# Patient Record
Sex: Male | Born: 1960 | Race: White | Hispanic: No | Smoking: Former smoker
Health system: Southern US, Community
[De-identification: ages and names within clinical notes are randomized; demographics above are authoritative.]

## PROBLEM LIST (undated history)

## (undated) DIAGNOSIS — E669 Obesity, unspecified: Secondary | ICD-10-CM

## (undated) DIAGNOSIS — I714 Abdominal aortic aneurysm, without rupture, unspecified: Secondary | ICD-10-CM

## (undated) DIAGNOSIS — E119 Type 2 diabetes mellitus without complications: Secondary | ICD-10-CM

## (undated) DIAGNOSIS — I471 Supraventricular tachycardia, unspecified: Secondary | ICD-10-CM

## (undated) DIAGNOSIS — E221 Hyperprolactinemia: Secondary | ICD-10-CM

## (undated) DIAGNOSIS — I2510 Atherosclerotic heart disease of native coronary artery without angina pectoris: Secondary | ICD-10-CM

## (undated) HISTORY — DX: Abdominal aortic aneurysm, without rupture, unspecified: I71.40

## (undated) HISTORY — DX: Obesity, unspecified: E66.9

## (undated) HISTORY — DX: Atherosclerotic heart disease of native coronary artery without angina pectoris: I25.10

## (undated) HISTORY — DX: Supraventricular tachycardia, unspecified: I47.10

## (undated) HISTORY — DX: Supraventricular tachycardia: I47.1

## (undated) HISTORY — DX: Type 2 diabetes mellitus without complications: E11.9

## (undated) HISTORY — DX: Abdominal aortic aneurysm, without rupture: I71.4

## (undated) HISTORY — PX: FOOT SURGERY: SHX648

## (undated) HISTORY — DX: Hyperprolactinemia: E22.1

---

## 2004-10-28 ENCOUNTER — Emergency Department: Payer: Self-pay | Admitting: Emergency Medicine

## 2004-12-30 ENCOUNTER — Ambulatory Visit: Payer: Self-pay | Admitting: Internal Medicine

## 2005-01-28 ENCOUNTER — Ambulatory Visit: Payer: Self-pay | Admitting: *Deleted

## 2005-01-31 ENCOUNTER — Ambulatory Visit: Payer: Self-pay | Admitting: Podiatry

## 2005-04-16 ENCOUNTER — Inpatient Hospital Stay: Payer: Self-pay | Admitting: Podiatry

## 2006-10-20 IMAGING — CT CT OF THE LEFT FOOT WITHOUT CONTRAST
3 series · 16 of 33 positions shown, 19 images · non-contrast
Comparison: none

REASON FOR EXAM: Charcot foot,  fractured cuboid
COMMENTS:

[Series 6: inspace · axial · 0.60mm/px · z∈[-1397,-1255]mm · 8 of 241 slices shown, 10 images]
[im 19/241  soft-tissue]
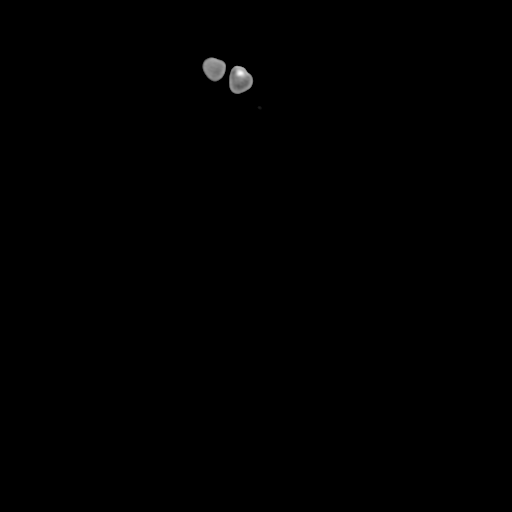
[im 19/241  bone]
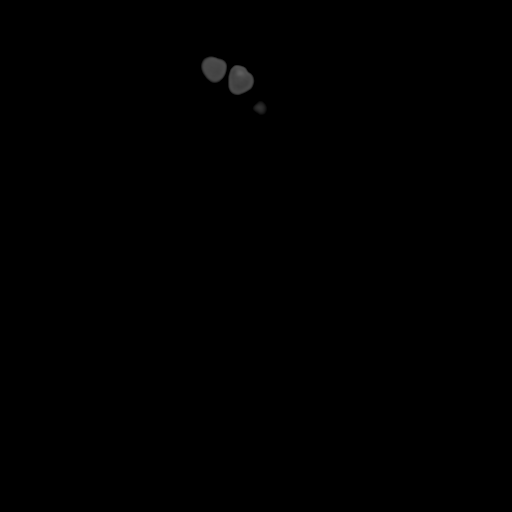
[im 56/241  bone]
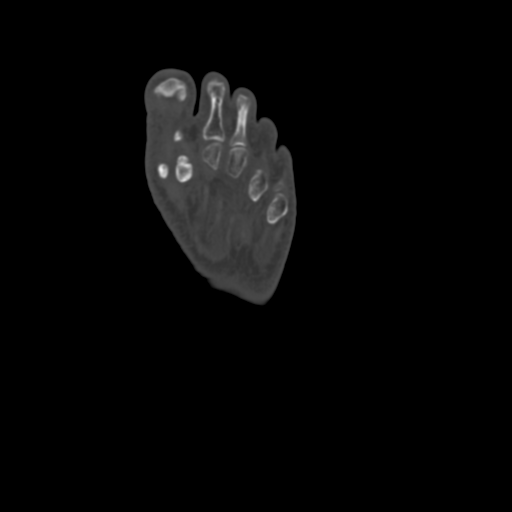
[im 74/241  bone]
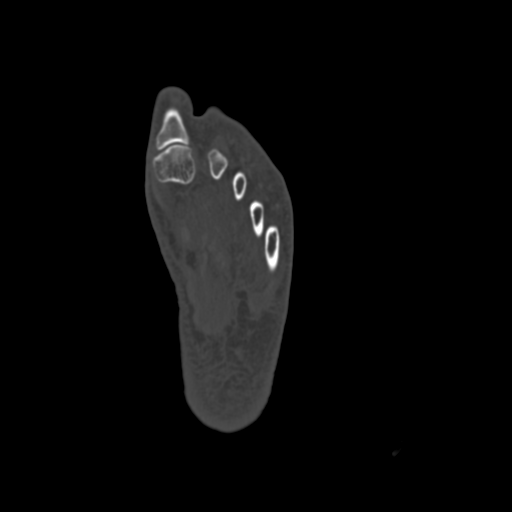
[im 111/241  bone]
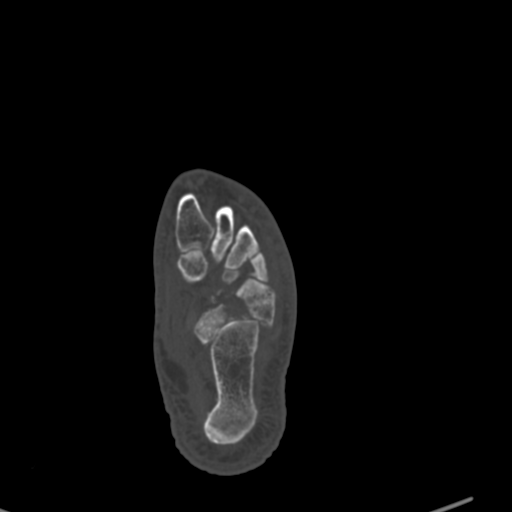
[im 130/241  soft-tissue]
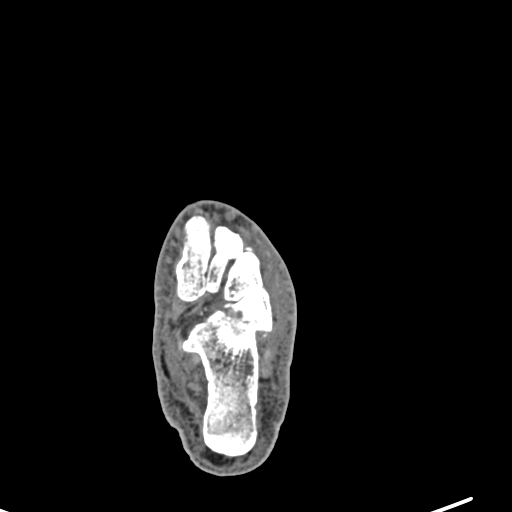
[im 130/241  bone]
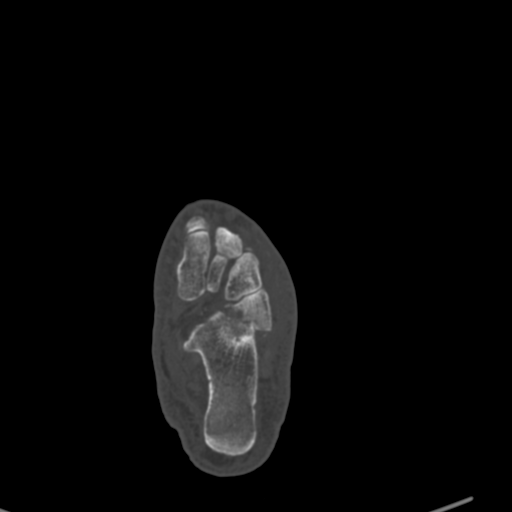
[im 167/241  bone]
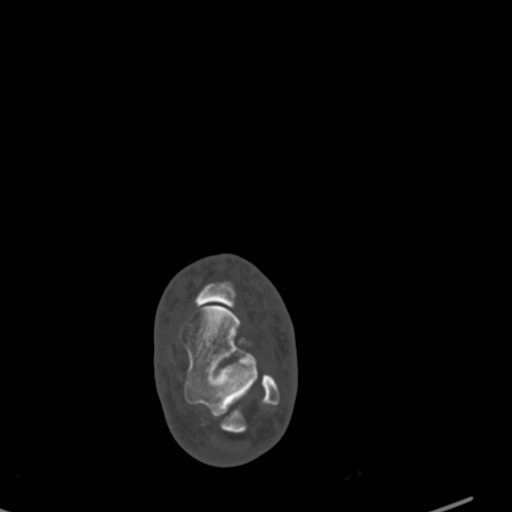
[im 185/241  bone]
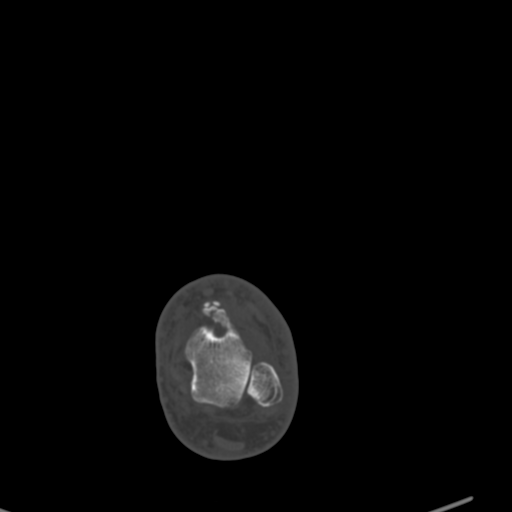
[im 222/241  bone]
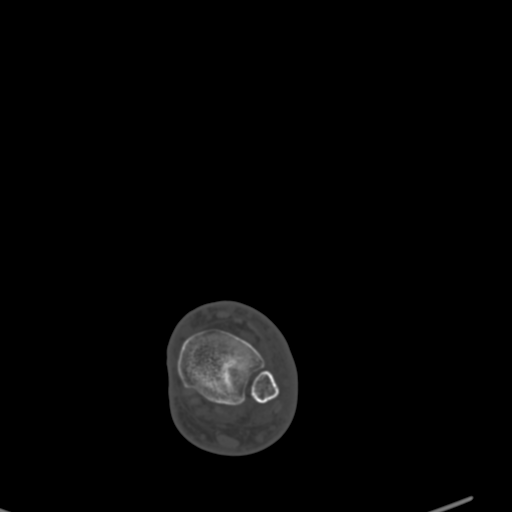

[Series 603: <mpr thick range> · coronal · 0.60mm/px · 3 of 120 slices shown]
[im 24/120  bone]
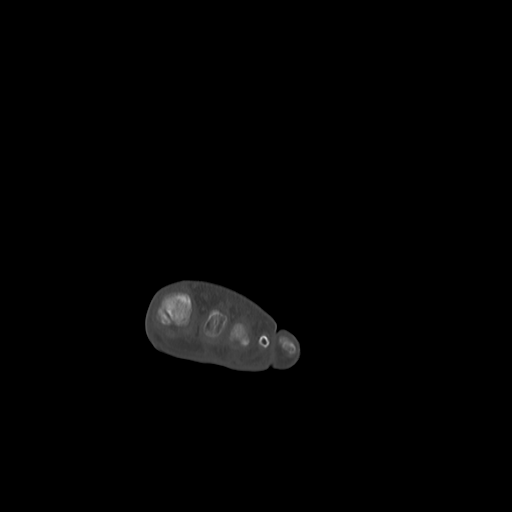
[im 48/120  bone]
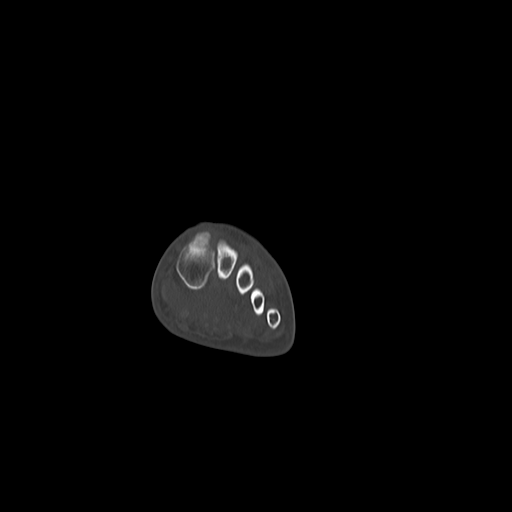
[im 72/120  bone]
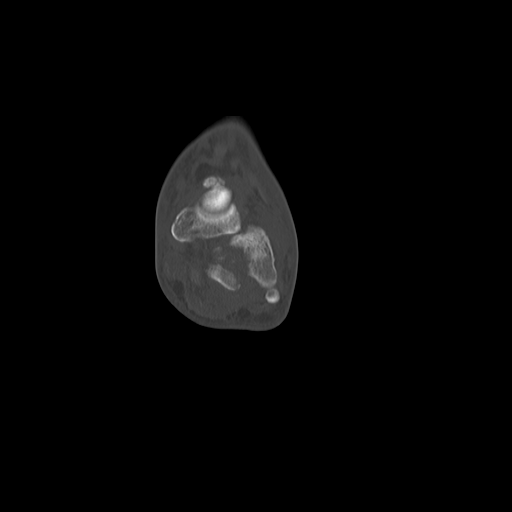

[Series 604: sagital · sagittal · 0.60mm/px · 5 of 42 slices shown, 6 images]
[im 14/42  bone]
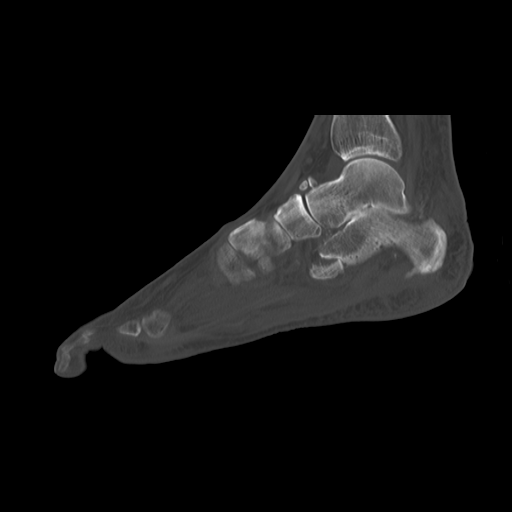
[im 18/42  bone]
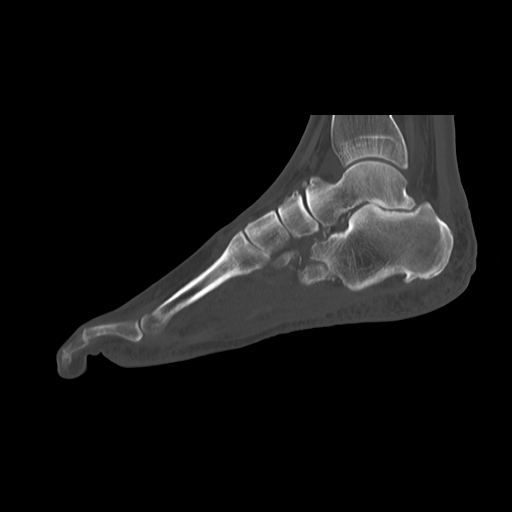
[im 21/42  soft-tissue]
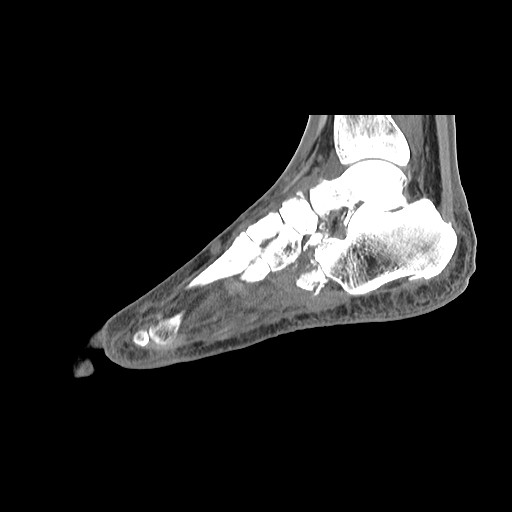
[im 21/42  bone]
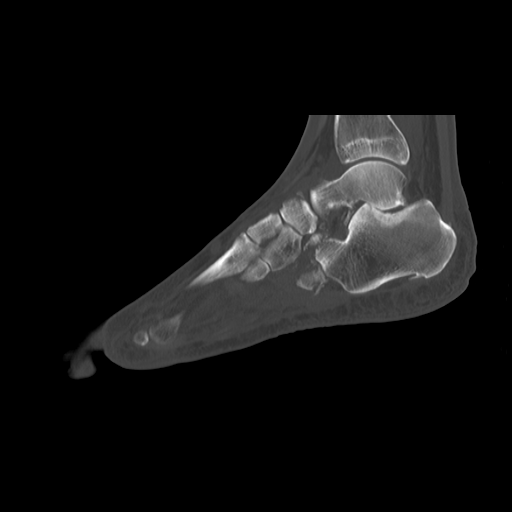
[im 24/42  bone]
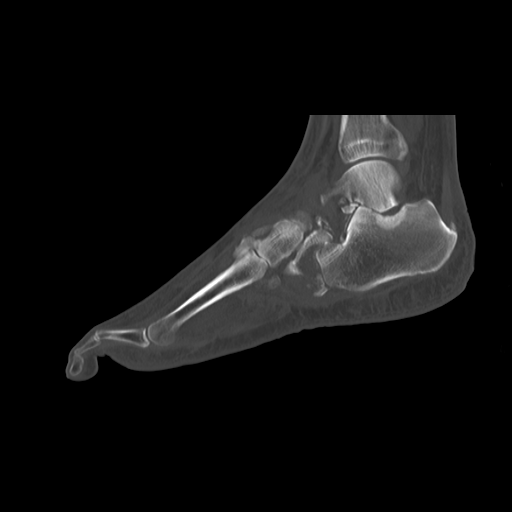
[im 28/42  bone]
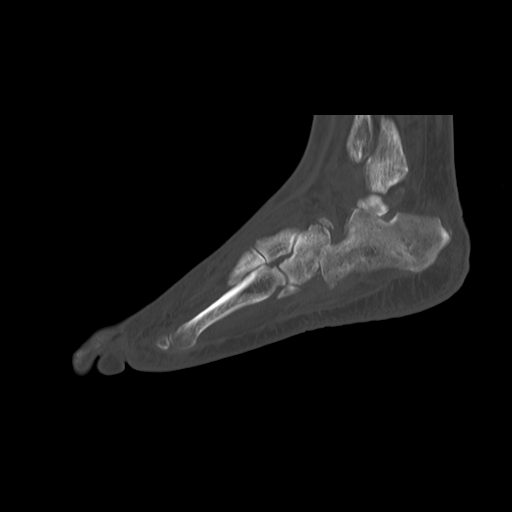

[16 of 33 positions shown; findings below may reference images not displayed]

PROCEDURE:     CT  - CT FOOT LEFT WITHOUT CONTRAST  - January 31, 2005  [DATE]

RESULT:         CT of the LEFT foot obtained with multiplanar/multislice
technique reveals multiple bony fragments about the subtalar calcaneal
cuboid and talonavicular joint with talocalcaneal joint subchondral
sclerosis and subluxation.  These changes are consistent with a Charcot
foot.  Small bony densities represent small fracture fragments.  There is a
large fracture fragment just anterior to the calcaneus. This may be arising
from the anterior aspect of the calcaneus or from the cuboid.  The malleoli
are intact. Cuneiform and proximal metatarsal heads are intact.  No lytic
lesions are noted.
IMPRESSION: 1.     Findings most consistent with Charcot foot with talocalcaneal,
calcaneocuboid and talonavicular degenerative change with associated
talocalcaneal subluxation.  Multiple bony fragments are noted about the
above described joints.
2.     There is a large bony fragment either arising from the anterior
calcaneus or from the cuboid.  This is just anterior to the calcaneus.

## 2010-12-08 ENCOUNTER — Emergency Department: Payer: Self-pay | Admitting: Unknown Physician Specialty

## 2011-02-18 HISTORY — PX: CARDIAC CATHETERIZATION: SHX172

## 2011-09-02 ENCOUNTER — Inpatient Hospital Stay: Payer: Self-pay | Admitting: Internal Medicine

## 2011-09-02 LAB — URINALYSIS, COMPLETE
Bilirubin,UR: NEGATIVE
Glucose,UR: >=500 mg/dL (ref 0–75)
Leukocyte Esterase: NEGATIVE
Nitrite: NEGATIVE
Protein: NEGATIVE
RBC,UR: <1 /HPF (ref 0–5)
Specific Gravity: 1.023 (ref 1.003–1.030)

## 2011-09-02 LAB — COMPREHENSIVE METABOLIC PANEL
Alkaline Phosphatase: 116 U/L (ref 50–136)
Anion Gap: 13 (ref 7–16)
Calcium, Total: 11.8 mg/dL — ABNORMAL HIGH (ref 8.5–10.1)
Co2: 27 mmol/L (ref 21–32)
EGFR (African American): >60
Osmolality: 283 (ref 275–301)
SGOT(AST): 37 U/L (ref 15–37)
SGPT (ALT): 83 U/L — ABNORMAL HIGH

## 2011-09-02 LAB — TROPONIN I: Troponin-I: 0.14 ng/mL — ABNORMAL HIGH

## 2011-09-02 LAB — CBC
HGB: 17 g/dL (ref 13.0–18.0)
MCV: 91 fL (ref 80–100)
Platelet: 215 10*3/uL (ref 150–440)
RBC: 5.63 10*6/uL (ref 4.40–5.90)
RDW: 13 % (ref 11.5–14.5)
WBC: 12 10*3/uL — ABNORMAL HIGH (ref 3.8–10.6)

## 2011-09-02 LAB — TSH: Thyroid Stimulating Horm: 2.04 u[IU]/mL

## 2011-09-03 LAB — CK TOTAL AND CKMB (NOT AT ARMC)
CK, Total: 38 U/L (ref 35–232)
CK, Total: 37 U/L (ref 35–232)
CK-MB: 0.7 ng/mL (ref 0.5–3.6)
CK-MB: 0.6 ng/mL (ref 0.5–3.6)

## 2011-09-03 LAB — LIPID PANEL
Cholesterol: 176 mg/dL (ref 0–200)
VLDL Cholesterol, Calc: 61 mg/dL — ABNORMAL HIGH (ref 5–40)

## 2011-09-03 LAB — TROPONIN I: Troponin-I: 0.12 ng/mL — ABNORMAL HIGH

## 2011-09-26 ENCOUNTER — Encounter: Payer: Self-pay | Admitting: Internal Medicine

## 2011-10-06 ENCOUNTER — Encounter: Payer: Self-pay | Admitting: Internal Medicine

## 2011-10-06 ENCOUNTER — Ambulatory Visit (INDEPENDENT_AMBULATORY_CARE_PROVIDER_SITE_OTHER): Payer: BC Managed Care – PPO | Admitting: Internal Medicine

## 2011-10-06 VITALS — BP 144/84 | HR 78 | Ht 74.0 in | Wt 285.2 lb

## 2011-10-06 DIAGNOSIS — I498 Other specified cardiac arrhythmias: Secondary | ICD-10-CM

## 2011-10-06 DIAGNOSIS — I471 Supraventricular tachycardia: Secondary | ICD-10-CM

## 2011-10-06 DIAGNOSIS — I714 Abdominal aortic aneurysm, without rupture: Secondary | ICD-10-CM

## 2011-10-06 DIAGNOSIS — R0789 Other chest pain: Secondary | ICD-10-CM

## 2011-10-06 DIAGNOSIS — R55 Syncope and collapse: Secondary | ICD-10-CM

## 2011-10-06 DIAGNOSIS — R002 Palpitations: Secondary | ICD-10-CM

## 2011-10-06 DIAGNOSIS — E119 Type 2 diabetes mellitus without complications: Secondary | ICD-10-CM | POA: Insufficient documentation

## 2011-10-06 MED ORDER — METOPROLOL TARTRATE 100 MG PO TABS: 100.0000 mg | ORAL_TABLET | Freq: Every day | ORAL | Status: DC

## 2011-10-06 MED ORDER — NADOLOL 40 MG PO TABS: 40.0000 mg | ORAL_TABLET | Freq: Every day | ORAL | Status: DC

## 2011-10-06 MED ORDER — ATENOLOL 100 MG PO TABS: 100.0000 mg | ORAL_TABLET | Freq: Two times a day (BID) | ORAL | Status: DC

## 2011-10-06 NOTE — Addendum Note (Signed)
Addended by: Sabino Snipes E on: 10/06/2011 12:29 PM   Modules accepted: Orders

## 2011-10-06 NOTE — Assessment & Plan Note (Signed)
Hemoglobin A1c was 9.4. Triglycerides are elevated and HDL is down. With her about the importance of exercise and weight loss.

## 2011-10-06 NOTE — Assessment & Plan Note (Signed)
The patient has had frequently recurring SVT which probably is mediated by a accessory pathway based on the initiation strips which we will able to review. He is currently treated with beta blockers and as his symptoms with the tachycardia are modest, he would like to continue with medical therapy if this will turn out to be effective. He is having significant somnolence with his metoprolol succinate/have given him prescriptions for metoprolol tartrate, atenolol, and Corgard to try as alternatives. We also discussed catheter ablation with its potential benefits and risks. For right now as noted he would like to pursue medical therapy. I am not sanguine that'll be sufficient but for right now it is reasonable.  His troponins were elevated during his episode of tachycardia. While this is attributable potentially to his tachycardia in the context of his known vascular disease and his diabetes his pretest likelihood of having coronary disease is quite high and I would recommend that he undergo stress testing with [name].  We will see him again in 8 wees

## 2011-10-06 NOTE — Patient Instructions (Addendum)
Your physician has recommended you make the following change in your medication:  1. Change Toprol to metoprolol tartrate 100 mg twice daily 2. Atenolol 100 mg daily 3. Nadolol 40 mg daily  You may try any of these medications, one at a time, in any order you want.  If you find you do not like one med, you may throw away and try the next medication.  Let us know which one you like best and we will send in refills.  Your physician wants you to follow-up in: 8-10 weeks with Dr. Graciela Husbands.

## 2011-10-06 NOTE — Progress Notes (Signed)
ELECTROPHYSIOLOGY CONSULT NOTE  Patient ID: Gabriel Alvarado, MRN: [id number], DOB/AGE: 53-Mar-1962 53 y.o. Admit date: (Not on file) Date of Consult: 10/06/2011  Primary Physician: No primary provider on file. Primary Cardiologist: KF  Chief Complaint: SVT   HPI Gabriel Alvarado is a 51 y.o. male  see at the request of [name] because of recurrent supraventricular tachycardia.  He is a 8 year history of diabetes as well as AAA disease and hyperprolactinemia. About a month ago he bent over and when he raised up his heart was racing. It was associated with a unusual breathing pattern but no significant shortness of breath. It was associated with chest tightness that radiated through to his back. There was no significant lightheadedness. He went to the emergency room where he received adenosine which terminated the tachycardia with only to resume. He was finally treated with a calcium blocker which allowed for its termination. Electrocardiogram was obtained which serendipitously recorded a resumption of tachycardia.   he was discharged on a beta blocker; because of recurring symptoms, he saw [name] who uptitrated his beta blocker from 50-100 mg metoprolol succinate with which he has had no significant recurrences.  An echocardiogram demonstrated normal left ventricular function normal left atrial size and mild concentric hypertrophy.  His functional status is quite good. He has mild dyspnea on exertion. He denies edema nocturnal dyspnea or orthopnea. He has had no exertional chest pain. He does have vascular disease as noted above. He has not had a stress test.  During his admission at the emergency room for his SVT his troponins were elevated Past Medical History  Diagnosis Date  . Diabetes mellitus   . Hyperprolactinemia   . AAA (abdominal aortic aneurysm)       Surgical History:  Past Surgical History  Procedure Date  . Foot surgery     left foot     Home Meds: Prior to  Admission medications   Medication Sig Start Date End Date Taking? Authorizing Provider  aspirin 81 MG tablet Take 81 mg by mouth daily.   Yes Historical Provider, MD  cabergoline (DOSTINEX) 0.5 MG tablet Takes 1/2 tablet once a week.   Yes Historical Provider, MD  glimepiride (AMARYL) 4 MG tablet Take 4 mg by mouth 2 (two) times daily.   Yes Historical Provider, MD  glucose blood test strip 1 each by Other route 3 (three) times daily. Use as instructed   Yes Historical Provider, MD  insulin glargine (LANTUS) 100 UNIT/ML injection Inject 40 Units into the skin at bedtime.   Yes Historical Provider, MD  metFORMIN (GLUCOPHAGE) 1000 MG tablet Takes 1 1/2 tablet daily.   Yes Historical Provider, MD  metoprolol succinate (TOPROL-XL) 50 MG 24 hr tablet Take 50 mg by mouth 2 (two) times daily. Take with or immediately following a meal.   Yes Historical Provider, MD  pioglitazone (ACTOS) 45 MG tablet Take 45 mg by mouth daily.   Yes Historical Provider, MD      Allergies:  Allergies  Allergen Reactions  . Penicillins   . Potassium-Containing Compounds     History   Social History  . Marital Status: Married    Spouse Name: N/A    Number of Children: N/A  . Years of Education: N/A   Occupational History  . Not on file.   Social History Main Topics  . Smoking status: Former Smoker -- 2.0 packs/day for 15 years    Types: Cigarettes    Quit date: 06/20/1990  .  Smokeless tobacco: Not on file  . Alcohol Use: Yes  . Drug Use: No  . Sexually Active:    Other Topics Concern  . Not on file   Social History Narrative  . No narrative on file     History reviewed. No pertinent family history. + arrhtyhmia   ROS:  Please see the history of present illness.     All other systems reviewed and negative.    Physical Exam: Blood pressure 144/84, pulse 78, height 6\' 2"  (1.88 m), weight 285 lb 4 oz (129.389 kg). General: Well developed, well nourished male in no acute distress. Head:  Normocephalic, atraumatic, sclera non-icteric, no xanthomas, nares are without discharge. Lymph Nodes:  none Back: without scoliosis/kyphosi, no CVA tendersness Neck: Negative for carotid bruits. JVD not elevated. Lungs: Clear bilaterally to auscultation without wheezes, rales, or rhonchi. Breathing is unlabored. Heart: RRR with S1 S2. No murmur , rubs, or gallops appreciated. Abdomen: Soft, non-tender, non-distended with normoactive bowel sounds. No hepatomegaly. No rebound/guarding. No obvious abdominal masses. Msk:  Strength and tone appear normal for age. Extremities: No clubbing or cyanosis. No  edema.  Distal pedal pulses are 2+ and equal bilaterally. He is wearing diabetic shoes Skin: Warm and Dry with a diffuse erythematous plaques Neuro: Alert and oriented X 3. CN III-XII intact Grossly normal sensory and motor function . Psych:  Responds to questions appropriately with a normal affect.      Labs: Cardiac Enzymes No results found for this basename: CKTOTAL:4,CKMB:4,TROPONINI:4 in the last 72 hours CBC No results found for this basename: WBC, HGB, HCT, MCV, PLT   PROTIME: No results found for this basename: LABPROT:3,INR:3 in the last 72 hours Chemistry No results found for this basename: NA,K,CL,CO2,BUN,CREATININE,CALCIUM,LABALBU,PROT,BILITOT,ALKPHOS,ALT,AST,GLUCOSE in the last 168 hours Lipids No results found for this basename: CHOL, HDL, LDLCALC, TRIG   BNP No results found for this basename: probnp   Miscellaneous No results found for this basename: DDIMER    Radiology/Studies:  No results found.  EKG:  Dated July 24 demonstrates sinus rhythm without evidence of ventricular preexcitation. There is evidence of left atrial enlargement. Intervals 16/10/41 there is minor ST segment depression in inferolateral leads. The data that he was July 24.  September 01 1798 hrs. SVT at 290 ms without discernible P wave of what appears to be described in the proximal portion of the ST  segment.   Electrocardiogram September 02 1719 hrs. demonstrated onset of tachycardia with a PAC not associated with prolongation of the PR interval. It is unclear whether the lesion described QRS include the retrograde P wave or not. I think that it does not and is simply a broad ventricular activation electrogram  Assessment and Plan:   Sherryl Manges

## 2011-10-06 NOTE — Assessment & Plan Note (Signed)
As above.

## 2011-10-16 ENCOUNTER — Ambulatory Visit: Payer: Self-pay | Admitting: Internal Medicine

## 2011-10-23 ENCOUNTER — Ambulatory Visit: Payer: Self-pay | Admitting: Cardiology

## 2011-10-24 LAB — BASIC METABOLIC PANEL
Anion Gap: 4 — ABNORMAL LOW (ref 7–16)
BUN: 10 mg/dL (ref 7–18)
Calcium, Total: 8.4 mg/dL — ABNORMAL LOW (ref 8.5–10.1)
Chloride: 106 mmol/L (ref 98–107)
Co2: 31 mmol/L (ref 21–32)
Creatinine: 0.78 mg/dL (ref 0.60–1.30)
Osmolality: 281 (ref 275–301)
Potassium: 4.2 mmol/L (ref 3.5–5.1)

## 2011-11-28 ENCOUNTER — Other Ambulatory Visit: Payer: Self-pay | Admitting: Internal Medicine

## 2011-11-28 MED ORDER — METOPROLOL TARTRATE 100 MG PO TABS: 100.0000 mg | ORAL_TABLET | Freq: Two times a day (BID) | ORAL | Status: DC

## 2011-11-28 NOTE — Telephone Encounter (Signed)
Refilled Metoprolol

## 2011-11-28 NOTE — Telephone Encounter (Signed)
Pt needs new script sent to say 100 mg twice daily per last office note

## 2011-12-02 ENCOUNTER — Ambulatory Visit (INDEPENDENT_AMBULATORY_CARE_PROVIDER_SITE_OTHER): Payer: BC Managed Care – PPO | Admitting: Internal Medicine

## 2011-12-02 ENCOUNTER — Encounter: Payer: Self-pay | Admitting: *Deleted

## 2011-12-02 ENCOUNTER — Encounter: Payer: Self-pay | Admitting: Internal Medicine

## 2011-12-02 VITALS — BP 118/74 | HR 74 | Ht 74.0 in | Wt 294.2 lb

## 2011-12-02 DIAGNOSIS — I2510 Atherosclerotic heart disease of native coronary artery without angina pectoris: Secondary | ICD-10-CM | POA: Insufficient documentation

## 2011-12-02 DIAGNOSIS — I498 Other specified cardiac arrhythmias: Secondary | ICD-10-CM

## 2011-12-02 DIAGNOSIS — I2589 Other forms of chronic ischemic heart disease: Secondary | ICD-10-CM

## 2011-12-02 DIAGNOSIS — I25810 Atherosclerosis of coronary artery bypass graft(s) without angina pectoris: Secondary | ICD-10-CM

## 2011-12-02 DIAGNOSIS — I255 Ischemic cardiomyopathy: Secondary | ICD-10-CM

## 2011-12-02 DIAGNOSIS — I471 Supraventricular tachycardia: Secondary | ICD-10-CM

## 2011-12-02 MED ORDER — LISINOPRIL 10 MG PO TABS: 10.0000 mg | ORAL_TABLET | Freq: Every day | ORAL | Status: DC

## 2011-12-02 MED ORDER — ATORVASTATIN CALCIUM 40 MG PO TABS: 40.0000 mg | ORAL_TABLET | Freq: Every day | ORAL | Status: DC

## 2011-12-02 NOTE — Assessment & Plan Note (Signed)
Will add ACE inhibitor therapy based on the SAVE prevention data as well as for diabetes renal protection. He'll need metabolic profile and couple weeks.

## 2011-12-02 NOTE — Patient Instructions (Addendum)
Your physician has recommended that you have an ablation. Catheter ablation is a medical procedure used to treat some cardiac arrhythmias (irregular heartbeats). During catheter ablation, a long, thin, flexible tube is put into a blood vessel in your groin (upper thigh), or neck. This tube is called an ablation catheter. It is then guided to your heart through the blood vessel. Radio frequency waves destroy small areas of heart tissue where abnormal heartbeats may cause an arrhythmia to start. Please see the instruction sheet given to you today.  Your physician has recommended you make the following change in your medication:  -start lipitor 40 mg daily -start lisinopril 10 mg daily  Your physician recommends that you return for lab work in: 2 weeks. Nothing to eat/drink after midnight.

## 2011-12-02 NOTE — Assessment & Plan Note (Signed)
I am very grateful to [name] for follow through for the patient's positive troponins. Thankfully he is now stented. I will add statin therapy particularly in the context of his diabetes. We will plan for fasting lipid profile in 2-3 weeks time at the time of BMET. For these labs to [name] upon their return

## 2011-12-02 NOTE — Assessment & Plan Note (Signed)
He continues to have episodes despite beta blocker therapy. He would like to undergo catheter ablation and I think that is the appropriate next step. As he has a recent stent, we'll plan to undertake the procedure without interruption of his Plavix. I have reviewed with him potential benefits and risks including but not limited to perforation failure to ablate successfully vascular injury death and stroke. He understands these risks and is willing to proceed.  He is quite large. He has significant abdominal obesity. He does not have symptoms however to suggest strong sleep apnea. I would advise that we undertake nocturnal oxygen saturation monitoring tonight after procedure and he may benefit from procedural CPAP support

## 2011-12-02 NOTE — Progress Notes (Signed)
Patient has no care team.   HPI  Gabriel Alvarado is a 50 y.o. 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 When seen in 8/13 he opted for medical therapy and a variety of beta blockers were tried.  He comes in today for followup continues to have episodes of SVT.  At his last visit we have been concerned about positive troponins in the context of diabetes and abdominal aortic aneurysm with SVT. 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  He is feeling better. He continues to have tachycardia.  Past Medical History  Diagnosis Date  . Diabetes mellitus   . Hyperprolactinemia   . AAA (abdominal aortic aneurysm)   . Obesity   . SVT (supraventricular tachycardia)     prob accessory pathyway    Past Surgical History  Procedure Date  . Foot surgery     left foot  . Cardiac catheterization 2013    x1; [facility], [place]    Current Outpatient Prescriptions  Medication Sig Dispense Refill  . aspirin 81 MG tablet Take 81 mg by mouth daily.      . cabergoline (DOSTINEX) 0.5 MG tablet Takes 1/2 tablet once a week.      . clopidogrel (PLAVIX) 75 MG tablet Take 75 mg by mouth daily.      . glimepiride (AMARYL) 4 MG tablet Take 4 mg by mouth 2 (two) times daily.      . glucose blood test strip 1 each by Other route 3 (three) times daily. Use as instructed      . insulin glargine (LANTUS) 100 UNIT/ML injection Inject 40 Units into the skin at bedtime.      . metFORMIN (GLUCOPHAGE) 1000 MG tablet Takes 1 1/2 tablet daily.      . metoprolol (LOPRESSOR) 100 MG tablet Take 1 tablet (100 mg total) by mouth 2 (two) times daily.  180 tablet  3  . pioglitazone (ACTOS) 45 MG tablet Take 45 mg by mouth daily.        Allergies  Allergen Reactions  . Penicillins   . Potassium-Containing Compounds     Review of Systems  negative except from HPI and PMH  Physical Exam BP 118/74  Pulse 74  Ht 6' 2" (1.88 m)  Wt 294 lb 4 oz (133.471 kg)  BMI 37.78 kg/m2 Well developed and well nourished in no acute distress HENT normal E scleral and icterus clear Neck Supple JVP flat; carotids brisk and full Clear to ausculation  Regular rate and rhythm, no murmurs gallops or rub Soft with active bowel sounds No clubbing cyanosis none Edema Alert and oriented, grossly normal motor and sensory function Skin Warm and Dry  Electrocardiogram demonstrates sinus rhythm at 74 Interval 17/10/39 Prominent P waves right and left sided. Poor R-wave progression   Assessment and  Plan  

## 2011-12-09 ENCOUNTER — Telehealth: Payer: Self-pay | Admitting: Internal Medicine

## 2011-12-09 NOTE — Telephone Encounter (Signed)
Pt calling states that he has an appt for an ablation in a couple weeks but is still having some episodes of svt. He is on metroprol and wants to know if there is something that he can change.

## 2011-12-10 MED ORDER — FLUMAZENIL 0.5 MG/5ML IV SOLN
INTRAVENOUS | Status: AC
  Filled 2011-12-10: qty 5

## 2011-12-10 NOTE — Addendum Note (Signed)
Addended by: Nolin Grell E on: 12/10/2011 09:30 AM   Modules accepted: Orders

## 2011-12-10 NOTE — Telephone Encounter (Signed)
Pt says he continues to have spells of SVT. He knows to cough, bear down, etc to try to break these spells but says it does not always help.  He confirms compliance with metoprolol 100 mg BID. I advised ok to try, after trying vagal maneuvers, an extra 1/2 (50 mg) of metoprolol during SVT to see if this helps.  He will try this if it happens again.  If no improvement, he will call us back.

## 2011-12-10 NOTE — Telephone Encounter (Signed)
See what I told pt Is this ok?

## 2011-12-11 NOTE — Telephone Encounter (Signed)
If sx dictate, we can try and get this moved up to  [place] earlier than Nov 5  Let me and GT and Tresa Endo know thanks

## 2011-12-12 ENCOUNTER — Encounter (HOSPITAL_COMMUNITY): Payer: Self-pay | Admitting: Pharmacy Technician

## 2011-12-15 NOTE — Telephone Encounter (Signed)
LMTCB

## 2011-12-16 ENCOUNTER — Ambulatory Visit (INDEPENDENT_AMBULATORY_CARE_PROVIDER_SITE_OTHER): Payer: BC Managed Care – PPO

## 2011-12-16 ENCOUNTER — Other Ambulatory Visit: Payer: BC Managed Care – PPO

## 2011-12-16 DIAGNOSIS — I498 Other specified cardiac arrhythmias: Secondary | ICD-10-CM

## 2011-12-16 DIAGNOSIS — I25810 Atherosclerosis of coronary artery bypass graft(s) without angina pectoris: Secondary | ICD-10-CM

## 2011-12-16 DIAGNOSIS — I471 Supraventricular tachycardia: Secondary | ICD-10-CM

## 2011-12-16 DIAGNOSIS — I255 Ischemic cardiomyopathy: Secondary | ICD-10-CM

## 2011-12-16 DIAGNOSIS — I2589 Other forms of chronic ischemic heart disease: Secondary | ICD-10-CM

## 2011-12-16 NOTE — Telephone Encounter (Signed)
Pt here for pre procedure labs Says he has tried the extra 1/2 metoprolol for svt yesterday. Says it took 2 hours to help before he finally returned to NSR. I offered him sooner appt than 11/5 for ablation He declines since it is only a week away.  Again we discussed vagal maneuvers and different techniques to help break the SVT.  Understanding verb.  He will call us back sooner than planned ablation should he have questions/concerns.

## 2011-12-17 LAB — BASIC METABOLIC PANEL
Calcium: 9.7 mg/dL (ref 8.7–10.2)
Creatinine, Ser: 0.91 mg/dL (ref 0.76–1.27)
GFR calc non Af Amer: 97 mL/min/{1.73_m2} (ref >59)
Glucose: 152 mg/dL — ABNORMAL HIGH (ref 65–99)
Sodium: 141 mmol/L (ref 134–144)

## 2011-12-17 LAB — CBC WITH DIFFERENTIAL
Basophils Absolute: 0 10*3/uL (ref 0.0–0.2)
Basos: 0 % (ref 0–3)
Eos: 3 % (ref 0–5)
HCT: 44.5 % (ref 37.5–51.0)
Hemoglobin: 14.6 g/dL (ref 12.6–17.7)
Lymphocytes Absolute: 1.6 10*3/uL (ref 0.7–3.1)
Lymphs: 25 % (ref 14–46)
MCV: 93 fL (ref 79–97)
Monocytes: 9 % (ref 4–12)
Neutrophils Absolute: 3.9 10*3/uL (ref 1.4–7.0)
RBC: 4.8 x10E6/uL (ref 4.14–5.80)
RDW: 13.4 % (ref 12.3–15.4)
WBC: 6.3 10*3/uL (ref 3.4–10.8)

## 2011-12-17 LAB — LIPID PANEL
Chol/HDL Ratio: 4.1 ratio units (ref 0.0–5.0)
HDL: 41 mg/dL (ref >39)

## 2011-12-17 LAB — PROTIME-INR
INR: 1 (ref 0.8–1.2)
Prothrombin Time: 10.3 s (ref 9.1–12.0)

## 2011-12-23 ENCOUNTER — Encounter (HOSPITAL_COMMUNITY)
Admission: RE | Disposition: A | Discharge status: Home / Self Care | Payer: Self-pay | Source: Ambulatory Visit | Attending: Internal Medicine

## 2011-12-23 ENCOUNTER — Ambulatory Visit (HOSPITAL_COMMUNITY)
Admission: RE | Admit: 2011-12-23 | Discharge: 2011-12-24 | Disposition: A | Discharge status: Home / Self Care | Payer: BC Managed Care – PPO | Source: Ambulatory Visit | Attending: Internal Medicine | Admitting: Internal Medicine

## 2011-12-23 ENCOUNTER — Encounter (HOSPITAL_COMMUNITY): Payer: Self-pay | Admitting: *Deleted

## 2011-12-23 DIAGNOSIS — E119 Type 2 diabetes mellitus without complications: Secondary | ICD-10-CM | POA: Insufficient documentation

## 2011-12-23 DIAGNOSIS — I471 Supraventricular tachycardia: Secondary | ICD-10-CM

## 2011-12-23 DIAGNOSIS — I255 Ischemic cardiomyopathy: Secondary | ICD-10-CM

## 2011-12-23 DIAGNOSIS — I2510 Atherosclerotic heart disease of native coronary artery without angina pectoris: Secondary | ICD-10-CM

## 2011-12-23 DIAGNOSIS — I498 Other specified cardiac arrhythmias: Secondary | ICD-10-CM | POA: Insufficient documentation

## 2011-12-23 DIAGNOSIS — I714 Abdominal aortic aneurysm, without rupture, unspecified: Secondary | ICD-10-CM | POA: Insufficient documentation

## 2011-12-23 HISTORY — PX: SUPRAVENTRICULAR TACHYCARDIA ABLATION: SHX5492

## 2011-12-23 LAB — GLUCOSE, CAPILLARY
Glucose-Capillary: 122 mg/dL — ABNORMAL HIGH (ref 70–99)
Glucose-Capillary: 85 mg/dL (ref 70–99)
Glucose-Capillary: 113 mg/dL — ABNORMAL HIGH (ref 70–99)

## 2011-12-23 SURGERY — SUPRAVENTRICULAR TACHYCARDIA ABLATION
Anesthesia: LOCAL

## 2011-12-23 MED ORDER — SODIUM CHLORIDE 0.9 % IV SOLN: 250.0000 mL | INTRAVENOUS | Status: DC | PRN

## 2011-12-23 MED ORDER — SODIUM CHLORIDE 0.9 % IJ SOLN: 3.0000 mL | INTRAMUSCULAR | Status: DC | PRN

## 2011-12-23 MED ORDER — FENTANYL CITRATE 0.05 MG/ML IJ SOLN
INTRAMUSCULAR | Status: AC
  Filled 2011-12-23: qty 2

## 2011-12-23 MED ORDER — SODIUM CHLORIDE 0.9 % IJ SOLN
3.0000 mL | Freq: Two times a day (BID) | INTRAMUSCULAR | Status: DC
  Administered 2011-12-23: 3 mL via INTRAVENOUS

## 2011-12-23 MED ORDER — BUPIVACAINE HCL (PF) 0.25 % IJ SOLN
INTRAMUSCULAR | Status: AC
  Filled 2011-12-23: qty 60

## 2011-12-23 MED ORDER — PIOGLITAZONE HCL 45 MG PO TABS
45.0000 mg | ORAL_TABLET | Freq: Every day | ORAL | Status: DC
  Administered 2011-12-23 – 2011-12-24 (×2): 45 mg via ORAL
  Filled 2011-12-23 (×2): qty 1

## 2011-12-23 MED ORDER — INSULIN GLARGINE 100 UNIT/ML ~~LOC~~ SOLN
15.0000 [IU] | Freq: Every morning | SUBCUTANEOUS | Status: DC
  Administered 2011-12-23 – 2011-12-24 (×2): 15 [IU] via SUBCUTANEOUS

## 2011-12-23 MED ORDER — HEPARIN (PORCINE) IN NACL 2-0.9 UNIT/ML-% IJ SOLN
INTRAMUSCULAR | Status: AC
  Filled 2011-12-23: qty 500

## 2011-12-23 MED ORDER — GLIMEPIRIDE 4 MG PO TABS
4.0000 mg | ORAL_TABLET | Freq: Two times a day (BID) | ORAL | Status: DC
  Administered 2011-12-23 – 2011-12-24 (×2): 4 mg via ORAL
  Filled 2011-12-23 (×4): qty 1

## 2011-12-23 MED ORDER — SODIUM CHLORIDE 0.9 % IV SOLN
INTRAVENOUS | Status: DC
  Administered 2011-12-23: 07:00:00 via INTRAVENOUS

## 2011-12-23 MED ORDER — MIDAZOLAM HCL 5 MG/5ML IJ SOLN
INTRAMUSCULAR | Status: AC
  Filled 2011-12-23: qty 5

## 2011-12-23 MED ORDER — INSULIN GLARGINE 100 UNIT/ML ~~LOC~~ SOLN: 15.0000 [IU] | Freq: Two times a day (BID) | SUBCUTANEOUS | Status: DC

## 2011-12-23 MED ORDER — INSULIN GLARGINE 100 UNIT/ML ~~LOC~~ SOLN
25.0000 [IU] | Freq: Every day | SUBCUTANEOUS | Status: DC
  Administered 2011-12-23: 25 [IU] via SUBCUTANEOUS

## 2011-12-23 MED ORDER — METOPROLOL TARTRATE 100 MG PO TABS
100.0000 mg | ORAL_TABLET | Freq: Two times a day (BID) | ORAL | Status: DC
  Administered 2011-12-23 – 2011-12-24 (×3): 100 mg via ORAL
  Filled 2011-12-23 (×4): qty 1

## 2011-12-23 MED ORDER — ACETAMINOPHEN 325 MG PO TABS
650.0000 mg | ORAL_TABLET | ORAL | Status: DC | PRN
  Administered 2011-12-23: 650 mg via ORAL
  Filled 2011-12-23: qty 2

## 2011-12-23 MED ORDER — METFORMIN HCL 500 MG PO TABS
1500.0000 mg | ORAL_TABLET | Freq: Every day | ORAL | Status: DC
Start: 2011-12-23 — End: 2011-12-23

## 2011-12-23 MED ORDER — ASPIRIN EC 81 MG PO TBEC
81.0000 mg | DELAYED_RELEASE_TABLET | Freq: Every day | ORAL | Status: DC
  Administered 2011-12-24: 81 mg via ORAL
  Filled 2011-12-23 (×2): qty 1

## 2011-12-23 MED ORDER — ONDANSETRON HCL 4 MG/2ML IJ SOLN: 4.0000 mg | Freq: Four times a day (QID) | INTRAMUSCULAR | Status: DC | PRN

## 2011-12-23 MED ORDER — HEPARIN SODIUM (PORCINE) 1000 UNIT/ML IJ SOLN
INTRAMUSCULAR | Status: AC
  Filled 2011-12-23: qty 1

## 2011-12-23 MED ORDER — METFORMIN HCL 500 MG PO TABS
1500.0000 mg | ORAL_TABLET | Freq: Every day | ORAL | Status: DC
  Administered 2011-12-24: 1500 mg via ORAL
  Filled 2011-12-23 (×3): qty 3

## 2011-12-23 MED ORDER — LISINOPRIL 10 MG PO TABS
10.0000 mg | ORAL_TABLET | Freq: Every day | ORAL | Status: DC
  Administered 2011-12-24: 10 mg via ORAL
  Filled 2011-12-23: qty 1

## 2011-12-23 MED ORDER — CLOPIDOGREL BISULFATE 75 MG PO TABS
75.0000 mg | ORAL_TABLET | Freq: Every day | ORAL | Status: DC
  Administered 2011-12-24: 75 mg via ORAL
  Filled 2011-12-23: qty 1

## 2011-12-23 MED ORDER — ATORVASTATIN CALCIUM 40 MG PO TABS
40.0000 mg | ORAL_TABLET | Freq: Every day | ORAL | Status: DC
  Filled 2011-12-23: qty 1

## 2011-12-23 NOTE — Op Note (Signed)
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 [id number].

## 2011-12-23 NOTE — Op Note (Signed)
NAME:  RILEE, WENDLING NO.:  [id number]  MEDICAL RECORD NO.:  [id number]  LOCATION:  [id number]                        FACILITY:  [facility]  PHYSICIAN:  Doylene Canning. Ladona Ridgel, MD    DATE OF BIRTH:  [birth year]  DATE OF PROCEDURE:  12/23/2011 DATE OF DISCHARGE:                              OPERATIVE REPORT   PROCEDURE PERFORMED:  Electrophysiologic study and RF catheter ablation of a concealed left posterior accessory pathway, which was causing recurrent SVT.  INTRODUCTION:  The patient is a 51 year old man who has a history of SVT dating back several months.  Prior to this, he has never had tachypalpitations.  He has had documented SVT terminated with intravenous adenosine on multiple occasions.  Despite medical therapy with beta-blocker, he has had recurrent SVT and is now referred for catheter ablation.  He notes that he can make the episodes terminate at times with vagal maneuvers.  PROCEDURE:  After informed consent was obtained, the patient was taken to the Diagnostic EP Lab in the fasting state.  After usual preparation and draping, intravenous fentanyl and midazolam was given for sedation. A 6-French hexapolar catheter was inserted percutaneously in the right jugular vein and advanced to the coronary sinus.  A 6-French quadripolar catheter was inserted percutaneously in the right femoral vein and advanced to the right ventricle.  A 6-French quadripolar catheter was inserted percutaneously in the right femoral vein and advanced to the His bundle region.  With catheter manipulation, the patient was spontaneously gone to SVT.  Rapid ventricular pacing was then carried out from the right ventricle and stepwise decreased down to 280 milliseconds.  During rapid ventricular pacing, the patient would have easily inducible SVT at a ventricular pacing cycle length of 290 milliseconds.  PVCs were then be placed at the time of His bundle refractoriness, which would ultimately  terminate the patient's SVT. Programmed ventricular stimulation was carried out from the right ventricle at base drive cycle length of 161 milliseconds.  The S1-S2 interval was stepwise decreased down to 310 milliseconds resulting in the initiation of SVT.  Next, programmed atrial stimulation was carried out at a base drive cycle length of 096 milliseconds.  The S1-S2 interval was stepwise decreased from 440 milliseconds, but the patient had incessant SVT, easily inducible.  Rapid atrial pacing resulted in the induction of SVT as well.  At this point, mapping of the patient's SVT was carried out.  This demonstrated a left posterior accessory pathway with the earliest activation at approximately 5 o'clock in the LAO projection on the mitral valve annulus.  At this point, a 7-French quadripolar ablation catheter was inserted percutaneously into the right femoral artery.  The 6000 units of heparin was then infused.  The ablation catheter was maneuvered retrograde across the aortic valve and into the left ventricle.  Mapping on the mitral annulus was then carried out.  This was carried out both with during SVT as well as with ventricular pacing.  The earliest atrial activation was at 5 o'clock. The ablation catheter was then maneuvered into this region and a single RF energy application was then delivered for approximately 50 seconds. During RF energy application, the activation sequence terminated during ventricular pacing.  Following this,  programmed atrial and ventricular stimulation and rapid atrial and ventricular pacing were carried out over the next 30 minutes.  During this time, there was no inducible SVT. The catheters were then removed, hemostasis was assured, and the patient was returned to his room in satisfactory condition.  COMPLICATIONS:  There were no immediate procedure complications.  RESULTS:  A.  Baseline ECG:  Baseline ECG demonstrates sinus rhythm with normal axis and  intervals. B.  Baseline intervals:  The sinus node cycle length was 700 milliseconds, the PR interval was 160 milliseconds, the AH interval was 71 milliseconds, the HV interval was 53 milliseconds, the QRS duration was 100 milliseconds. C.  Rapid ventricular pacing:  Rapid ventricular pacing was carried out from the right ventricle demonstrating eccentric atrial activation.  The activation was also nondecremental. D.  Programmed ventricular stimulation:  Programmed ventricular stimulation was carried out from the right ventricle at a base drive cycle length of 045 milliseconds.  The S1-S2 interval was stepwise decreased down to 200 milliseconds.  Prior to catheter ablation, the atrial activation was eccentric and nondecremental until we got down to 250 milliseconds where it then became decremental and concentric. Following catheter ablation, the atrial activation was always concentric and decremental.  Following ablation, there was no inducible SVT. E.  Rapid atrial pacing:  Rapid atrial pacing was carried out from the atrium at a base drive cycle length of 409 milliseconds.  This resulted in the induction of SVT.  During rapid atrial pacing, the patient would immediately going to SVT.  Following catheter ablation, rapid atrial pacing was carried out demonstrating an AV Wenckebach cycle length of 280 milliseconds. F.  Programmed atrial stimulation:  Programmed atrial stimulation was carried out from the coronary sinus in the atrium at base drive cycle length of 811 milliseconds.  The S1-S2 interval was carried out down to 230 milliseconds after ablation resulting in AV node ERP.  Prior to catheter ablation, rapid atrial pacing resulted in easily inducible SVT. G.  Arrhythmias observed: 1. AV reentrant tachycardia initiation was with rapid atrial and     ventricular pacing as well as programmed atrial and ventricular     stimulation.  The duration was sustained, cycle length was 290      milliseconds.  The method of termination was with PVCs or     spontaneously.     a.     Mapping:  Mapping of the patient's accessory pathway      demonstrated a left posterior accessory pathway with earliest      atrial activation during ventricular pacing and during SVT at 5      o'clock on the mitral valve anulus.     b.     RF energy application:  A single RF energy application was      delivered to the atrial insertion of the accessory pathway      resulting in termination of accessory pathway conduction.  CONCLUSION:  This study demonstrates successful electrophysiologic study and RF catheter ablation of AV reentrant tachycardia with a single RF energy application delivered to the site of 5 o'clock on the mitral valve annulus resulting in resolution of any accessory pathway conduction.     Doylene Canning. Ladona Ridgel, MD     GWT/MEDQ  D:  12/23/2011  T:  12/23/2011  Job:  [id number]  cc:   Duke Salvia, MD, [facility] Harold Hedge, MD

## 2011-12-23 NOTE — Interval H&P Note (Signed)
History and Physical Interval Note:  12/23/2011 7:14 AM  Gabriel Alvarado  has presented today for surgery, with the diagnosis of svt  The various methods of treatment have been discussed with the patient and family. After consideration of risks, benefits and other options for treatment, the patient has consented to  Procedure(s) (LRB) with comments: SUPRAVENTRICULAR TACHYCARDIA ABLATION (N/A) as a surgical intervention .  The patient's history has been reviewed, patient examined, no change in status, stable for surgery.  I have reviewed the patient's chart and labs.  Questions were answered to the patient's satisfaction.     Leonia Reeves.D

## 2011-12-23 NOTE — H&P (View-Only) (Signed)
Patient has no care team.   HPI  Gabriel Alvarado is a 51 y.o. 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 When seen in 8/13 he opted for medical therapy and a variety of beta blockers were tried.  He comes in today for followup continues to have episodes of SVT.  At his last visit we have been concerned about positive troponins in the context of diabetes and abdominal aortic aneurysm with SVT. 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  He is feeling better. He continues to have tachycardia.  Past Medical History  Diagnosis Date  . Diabetes mellitus   . Hyperprolactinemia   . AAA (abdominal aortic aneurysm)   . Obesity   . SVT (supraventricular tachycardia)     prob accessory pathyway    Past Surgical History  Procedure Date  . Foot surgery     left foot  . Cardiac catheterization 2013    x1; [facility], [place]    Current Outpatient Prescriptions  Medication Sig Dispense Refill  . aspirin 81 MG tablet Take 81 mg by mouth daily.      . cabergoline (DOSTINEX) 0.5 MG tablet Takes 1/2 tablet once a week.      . clopidogrel (PLAVIX) 75 MG tablet Take 75 mg by mouth daily.      Marland Kitchen glimepiride (AMARYL) 4 MG tablet Take 4 mg by mouth 2 (two) times daily.      Marland Kitchen glucose blood test strip 1 each by Other route 3 (three) times daily. Use as instructed      . insulin glargine (LANTUS) 100 UNIT/ML injection Inject 40 Units into the skin at bedtime.      . metFORMIN (GLUCOPHAGE) 1000 MG tablet Takes 1 1/2 tablet daily.      . metoprolol (LOPRESSOR) 100 MG tablet Take 1 tablet (100 mg total) by mouth 2 (two) times daily.  180 tablet  3  . pioglitazone (ACTOS) 45 MG tablet Take 45 mg by mouth daily.        Allergies  Allergen Reactions  . Penicillins   . Potassium-Containing Compounds     Review of Systems  negative except from HPI and PMH  Physical Exam BP 118/74  Pulse 74  Ht 6\' 2"  (1.88 m)  Wt 294 lb 4 oz (133.471 kg)  BMI 37.78 kg/m2 Well developed and well nourished in no acute distress HENT normal E scleral and icterus clear Neck Supple JVP flat; carotids brisk and full Clear to ausculation  Regular rate and rhythm, no murmurs gallops or rub Soft with active bowel sounds No clubbing cyanosis none Edema Alert and oriented, grossly normal motor and sensory function Skin Warm and Dry  Electrocardiogram demonstrates sinus rhythm at 74 Interval 17/10/39 Prominent P waves right and left sided. Poor R-wave progression   Assessment and  Plan

## 2011-12-24 LAB — GLUCOSE, CAPILLARY: Glucose-Capillary: 127 mg/dL — ABNORMAL HIGH (ref 70–99)

## 2011-12-24 NOTE — Progress Notes (Signed)
   ELECTROPHYSIOLOGY ROUNDING NOTE    Patient Name: MILOH Alvarado Date of Encounter: 12-24-2011    SUBJECTIVE:Patient feels well.  No chest pain or shortness of breath.  S/p RFCA of SVT 12-23-2011  TELEMETRY: Reviewed telemetry pt in sinus rhythm Filed Vitals:   12/23/11 1400 12/23/11 1624 12/23/11 2101 12/23/11 2231  BP: 129/88 117/77 121/82 115/79  Pulse: 73  78 76  Temp: 98.4 F (36.9 C)  98.2 F (36.8 C)   TempSrc: Oral  Oral   Resp: 20  18   Height:      Weight:      SpO2: 99%  97%     Intake/Output Summary (Last 24 hours) at 12/24/11 1610 Last data filed at 12/24/11 0020  Gross per 24 hour  Intake    640 ml  Output   2250 ml  Net  -1610 ml    PHYSICAL EXAM Groin and neck incision without complication  Wound care, restrictions reviewed with patient.  Follow up scheduled with Dr Gabriel Alvarado in [place] office in 12 weeks.  Pt has follow up scheduled with Dr Gabriel Alvarado.  EP Attending  Patient seen and examined. Agree with the above exam, assessment and plan. Ok for discharge.  Gabriel Alvarado.D.

## 2011-12-24 NOTE — Discharge Summary (Signed)
ELECTROPHYSIOLOGY PROCEDURE DISCHARGE SUMMARY    Patient ID: Gabriel Alvarado,  MRN: [id number], DOB/AGE: 1960/10/27 51 y.o.  Admit date: 12/23/2011 Discharge date: 01/23/2012  Primary Cardiologist: Mariel Kansky, MD Electrophysiologist: Sherryl Manges, MD  Primary Discharge Diagnosis:  Supraventricular tachycardia status post ablation this admission  Secondary Discharge Diagnosis:  1.  Coronary artery disease status post DES to RCA earlier this year 2.  Diabetes 3.  AAA 4.  Obesity  Procedures This Admission:  1.  Electrophysiology study and radiofrequency catheter ablation on 12-23-2011 by Dr Ladona Ridgel.  This study demonstrated successful electrophysiologic study  and RF catheter ablation of AV reentrant tachycardia with a single RF energy application delivered to the site of 5 o'clock on the mitral valve annulus resulting in resolution of any accessory pathway conduction.  Brief HPI: Gabriel Alvarado is a 51 y.o. 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 When seen in 8/13 he opted for medical therapy and a variety of beta blockers were tried. Despite beta blocker, he had continued episodes of SVT.  At his last visit we have been concerned about positive troponins in the context of diabetes and abdominal aortic aneurysm with SVT. 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 He is feeling better. Risks, benefits, and alternatives to ablation were reviewed with the patient who wished to proceed.   Hospital Course:  The patient was admitted on 12-23-2011 for planned ablation of SVT.  This was carried out by Dr Ladona Ridgel with details as outlined above.  He was monitored on telemetry overnight which demonstrated sinus rhythm.  His neck and groin incisions were without complications. Dr Ladona Ridgel examined the  patient and considered him stable for discharge to home.   Discharge Vitals: Blood pressure 117/74, pulse 67, temperature 97.5 F (36.4 C), temperature source Oral, resp. rate 20, height 6\' 2"  (1.88 m), weight 285 lb (129.275 kg), SpO2 1.00%.    Labs:   Lab Results  Component Value Date   WBC 6.3 12/16/2011   HGB 14.6 12/16/2011   HCT 44.5 12/16/2011   MCV 93 12/16/2011   PLT 212 12/16/2011   Lab Results  Component Value Date   HDL 41 12/16/2011   Lab Results  Component Value Date   LDLCALC 97 12/16/2011   Lab Results  Component Value Date   TRIG 158* 12/16/2011   Lab Results  Component Value Date   CHOLHDL 4.1 12/16/2011    Discharge Medications:    Medication List     As of 01/23/2012  7:26 AM    TAKE these medications         aspirin EC 81 MG tablet   Take 81 mg by mouth daily.      atorvastatin 40 MG tablet   Commonly known as: LIPITOR   Take 1 tablet (40 mg total) by mouth daily.      cabergoline 0.5 MG tablet   Commonly known as: DOSTINEX   Take 0.25 mg by mouth once a week.      clopidogrel 75 MG tablet   Commonly known as: PLAVIX   Take 75 mg by mouth daily.      glimepiride 4 MG tablet   Commonly known as: AMARYL   Take 4 mg by mouth 2 (two) times daily.      insulin glargine 100 UNIT/ML injection  Commonly known as: LANTUS   Inject 15-25 Units into the skin 2 (two) times daily. 15 units in the morning and 25 units at night      lisinopril 10 MG tablet   Commonly known as: PRINIVIL,ZESTRIL   Take 1 tablet (10 mg total) by mouth daily.      metFORMIN 1000 MG tablet   Commonly known as: GLUCOPHAGE   Take 1,500 mg by mouth daily.      metoprolol 100 MG tablet   Commonly known as: LOPRESSOR   Take 1 tablet (100 mg total) by mouth 2 (two) times daily.      pioglitazone 45 MG tablet   Commonly known as: ACTOS   Take 45 mg by mouth daily.          Disposition:  Discharge Orders    Future Appointments: Provider: Department: Dept  Phone: Center:   03/23/2012 2:45 PM Duke Salvia, MD [facility] [phone] [id number]     Future Orders Please Complete By Expires   Diet - low sodium heart healthy      Increase activity slowly        Follow-up Information    Follow up with Sherryl Manges, MD. On 03/23/2012. (At 2:45 PM)    Contact information:   [address]   [place] [address] [phone]          Duration of Discharge Encounter: Greater than 30 minutes including physician time.  Signed, Gypsy Balsam, RN, BSN 01/23/2012, 7:26 AM

## 2011-12-24 NOTE — Progress Notes (Signed)
Pt provided with dc instructions and education. Pt verbalized understanding. Pt aware of follow up appointments and is planning to attend them as scheduled. Educated on meds and pt able to [employer]. IV removed with tip intact. Heart monitor returned to front. Pt leaving with family for home. Levonne Spiller, RN

## 2012-03-23 ENCOUNTER — Encounter: Payer: BC Managed Care – PPO | Admitting: Internal Medicine

## 2012-03-23 ENCOUNTER — Ambulatory Visit (INDEPENDENT_AMBULATORY_CARE_PROVIDER_SITE_OTHER): Payer: BC Managed Care – PPO | Admitting: Internal Medicine

## 2012-03-23 ENCOUNTER — Encounter: Payer: Self-pay | Admitting: Internal Medicine

## 2012-03-23 VITALS — BP 140/80 | Ht 75.0 in | Wt 306.2 lb

## 2012-03-23 DIAGNOSIS — I471 Supraventricular tachycardia, unspecified: Secondary | ICD-10-CM

## 2012-03-23 DIAGNOSIS — I10 Essential (primary) hypertension: Secondary | ICD-10-CM | POA: Insufficient documentation

## 2012-03-23 DIAGNOSIS — I2510 Atherosclerotic heart disease of native coronary artery without angina pectoris: Secondary | ICD-10-CM

## 2012-03-23 DIAGNOSIS — I498 Other specified cardiac arrhythmias: Secondary | ICD-10-CM

## 2012-03-23 MED ORDER — LISINOPRIL 10 MG PO TABS: 20.0000 mg | ORAL_TABLET | Freq: Every day | ORAL | Status: DC

## 2012-03-23 MED ORDER — METOPROLOL TARTRATE 100 MG PO TABS: 50.0000 mg | ORAL_TABLET | Freq: Two times a day (BID) | ORAL | Status: DC

## 2012-03-23 NOTE — Assessment & Plan Note (Signed)
S/p PCI followup with Dr Lady Gary

## 2012-03-23 NOTE — Patient Instructions (Addendum)
Your physician wants you to follow-up with [name]. You will receive a reminder letter in the mail two months in advance. If you don't receive a letter, please call our office to schedule the follow-up appointment.  Your physician recommends that you return for lab work in: 3 weeks  Your physician has recommended you make the following change in your medication:  -increase lisinopril to 20 mg daily (2 tablets) -decrease metoprolol to 50 mg twice daily (1/2 tablet)

## 2012-03-23 NOTE — Assessment & Plan Note (Signed)
Blood pressure is elevated. As we decrease his Lopressor as noted above we'll increase his lisinopril from 10-20. We'll check a metabolic profile in 3 weeks time. He is to follow up with his endocrinologist thereafter.

## 2012-03-23 NOTE — Assessment & Plan Note (Signed)
No clinical recurrences. We will decrease his Lopressor from 100-50 twice a day as it seems to be some lassitude and weight gain and more sexual dysfunction on it.

## 2012-03-23 NOTE — Progress Notes (Signed)
skf .sfk Patient has no care team.   HPI  Gabriel Alvarado is a 52 y.o. male Seen in followup for SVT. He underwent catheter ablation 11/13 a left posterior accessory pathway. He has had no recurrent tachypalpitations. He denies daytime somnolence. No significant edema. Struggling with keeping his weight down.  Past Medical History  Diagnosis Date  . Diabetes mellitus   . Hyperprolactinemia   . AAA (abdominal aortic aneurysm)   . Obesity   . SVT- s/p RFCA 10/1     left posterior accessory pathyway  . CAD (coronary artery disease) s/p PCI     10/13    Past Surgical History  Procedure Date  . Foot surgery     left foot  . Cardiac catheterization 2013    x1; [facility], [place]    Current Outpatient Prescriptions  Medication Sig Dispense Refill  . aspirin EC 81 MG tablet Take 81 mg by mouth daily.      Marland Kitchen atorvastatin (LIPITOR) 40 MG tablet Take 1 tablet (40 mg total) by mouth daily.  90 tablet  3  . cabergoline (DOSTINEX) 0.5 MG tablet Take 0.25 mg by mouth once a week.       . clopidogrel (PLAVIX) 75 MG tablet Take 75 mg by mouth daily.      Marland Kitchen glimepiride (AMARYL) 4 MG tablet Take 4 mg by mouth 2 (two) times daily.      . insulin glargine (LANTUS) 100 UNIT/ML injection Inject 15-25 Units into the skin 2 (two) times daily. 15 units in the morning and 25 units at night      . lisinopril (PRINIVIL,ZESTRIL) 10 MG tablet Take 1 tablet (10 mg total) by mouth daily.  90 tablet  3  . metFORMIN (GLUCOPHAGE) 1000 MG tablet Take 1,500 mg by mouth daily.       . metoprolol (LOPRESSOR) 100 MG tablet Take 1 tablet (100 mg total) by mouth 2 (two) times daily.  180 tablet  3  . pioglitazone (ACTOS) 45 MG tablet Take 45 mg by mouth daily.        Allergies  Allergen Reactions  . Penicillins Other (See Comments)    Unknown reaction pt has taken pcn derivatives   . Potassium-Containing Compounds Other (See Comments)    Unknown reaction-pt unaware of any allergy to kcl    Review of  Systems negative except from HPI and PMH  Physical Exam BP 140/80  Ht 6\' 3"  (1.905 m)  Wt 306 lb 4 oz (138.914 kg)  BMI 38.28 kg/m2 Well developed and well nourished in no acute distress HENT normal E scleral and icterus clear Neck Supple Clear to ausculation  Regular rate and rhythm, no murmurs gallops or rub Soft with active bowel sounds No clubbing cyanosis none Edema Alert and oriented, grossly normal motor and sensory function Skin Warm and Dry  Electrocardiogram demonstrates sinus rhythm at 82 Intervals 16/10/37 Otherwise normal   Assessment and  Plan

## 2012-04-13 ENCOUNTER — Ambulatory Visit (INDEPENDENT_AMBULATORY_CARE_PROVIDER_SITE_OTHER): Payer: BC Managed Care – PPO

## 2012-04-13 DIAGNOSIS — I471 Supraventricular tachycardia: Secondary | ICD-10-CM

## 2012-04-13 DIAGNOSIS — I498 Other specified cardiac arrhythmias: Secondary | ICD-10-CM

## 2012-04-14 LAB — BASIC METABOLIC PANEL
CO2: 28 mmol/L (ref 19–28)
Calcium: 9.4 mg/dL (ref 8.7–10.2)
Potassium: 5.1 mmol/L (ref 3.5–5.2)
Sodium: 141 mmol/L (ref 134–144)

## 2012-04-27 ENCOUNTER — Encounter: Payer: Self-pay | Admitting: Internal Medicine

## 2012-05-03 ENCOUNTER — Encounter: Payer: Self-pay | Admitting: Internal Medicine

## 2012-06-13 ENCOUNTER — Other Ambulatory Visit: Payer: Self-pay

## 2012-06-13 LAB — CBC WITH DIFFERENTIAL/PLATELET
Basophil #: 0.1 10*3/uL (ref 0.0–0.1)
Eosinophil #: 0.3 10*3/uL (ref 0.0–0.7)
Eosinophil %: 3.1 %
HGB: 14.3 g/dL (ref 13.0–18.0)
Lymphocyte #: 1.8 10*3/uL (ref 1.0–3.6)
MCH: 29.3 pg (ref 26.0–34.0)
MCV: 89 fL (ref 80–100)
Monocyte #: 0.7 x10 3/mm (ref 0.2–1.0)
Neutrophil #: 6.4 10*3/uL (ref 1.4–6.5)
Platelet: 236 10*3/uL (ref 150–440)
RBC: 4.86 10*6/uL (ref 4.40–5.90)
WBC: 9.3 10*3/uL (ref 3.8–10.6)

## 2012-06-13 LAB — CREATININE, SERUM
Creatinine: 0.7 mg/dL (ref 0.60–1.30)
EGFR (African American): >60
EGFR (Non-African Amer.): >60

## 2012-06-13 LAB — BUN: BUN: 13 mg/dL (ref 7–18)

## 2012-06-21 ENCOUNTER — Other Ambulatory Visit: Payer: Self-pay | Admitting: Sports Medicine

## 2012-06-21 LAB — CBC WITH DIFFERENTIAL/PLATELET
Eosinophil %: 2.6 %
Lymphocyte #: 1.4 10*3/uL (ref 1.0–3.6)
MCH: 29.1 pg (ref 26.0–34.0)
MCHC: 33 g/dL (ref 32.0–36.0)
Monocyte #: 0.8 x10 3/mm (ref 0.2–1.0)
Neutrophil #: 8 10*3/uL — ABNORMAL HIGH (ref 1.4–6.5)
Neutrophil %: 75.5 %
Platelet: 221 10*3/uL (ref 150–440)
RBC: 4.74 10*6/uL (ref 4.40–5.90)
RDW: 14.3 % (ref 11.5–14.5)
WBC: 10.5 10*3/uL (ref 3.8–10.6)

## 2012-06-21 LAB — BUN: BUN: 9 mg/dL (ref 7–18)

## 2012-06-21 LAB — CREATININE, SERUM: EGFR (African American): >60

## 2012-07-12 ENCOUNTER — Other Ambulatory Visit: Payer: Self-pay

## 2012-07-12 LAB — CBC WITH DIFFERENTIAL/PLATELET
Basophil #: 0 10*3/uL (ref 0.0–0.1)
Basophil %: 0.4 %
Eosinophil #: 0.2 10*3/uL (ref 0.0–0.7)
Eosinophil %: 2.8 %
HCT: 42.1 % (ref 40.0–52.0)
HGB: 14.5 g/dL (ref 13.0–18.0)
Lymphocyte #: 1.7 10*3/uL (ref 1.0–3.6)
Lymphocyte %: 20.6 %
MCH: 29.8 pg (ref 26.0–34.0)
MCHC: 34.4 g/dL (ref 32.0–36.0)
MCV: 87 fL (ref 80–100)
Monocyte #: 0.6 x10 3/mm (ref 0.2–1.0)
Monocyte %: 7.4 %
Neutrophil #: 5.7 10*3/uL (ref 1.4–6.5)
Neutrophil %: 68.8 %
Platelet: 221 10*3/uL (ref 150–440)
RBC: 4.86 10*6/uL (ref 4.40–5.90)
RDW: 14 % (ref 11.5–14.5)
WBC: 8.3 10*3/uL (ref 3.8–10.6)

## 2012-07-12 LAB — BUN: BUN: 14 mg/dL (ref 7–18)

## 2012-07-12 LAB — CREATININE, SERUM
Creatinine: 0.64 mg/dL (ref 0.60–1.30)
EGFR (African American): >60
EGFR (Non-African Amer.): >60

## 2012-11-24 ENCOUNTER — Other Ambulatory Visit: Payer: Self-pay

## 2012-11-24 MED ORDER — ATORVASTATIN CALCIUM 40 MG PO TABS: 40.0000 mg | ORAL_TABLET | Freq: Every day | ORAL | Status: DC

## 2013-05-21 IMAGING — CR DG CHEST 1V PORT
1 series · 1 of 1 positions shown · non-contrast
Comparison: none

REASON FOR EXAM: chest tightness
COMMENTS:

PROCEDURE:     DXR - DXR PORTABLE CHEST SINGLE VIEW  - September 02, 2011  [DATE]
RESULT:     Comparison: None

[portable]
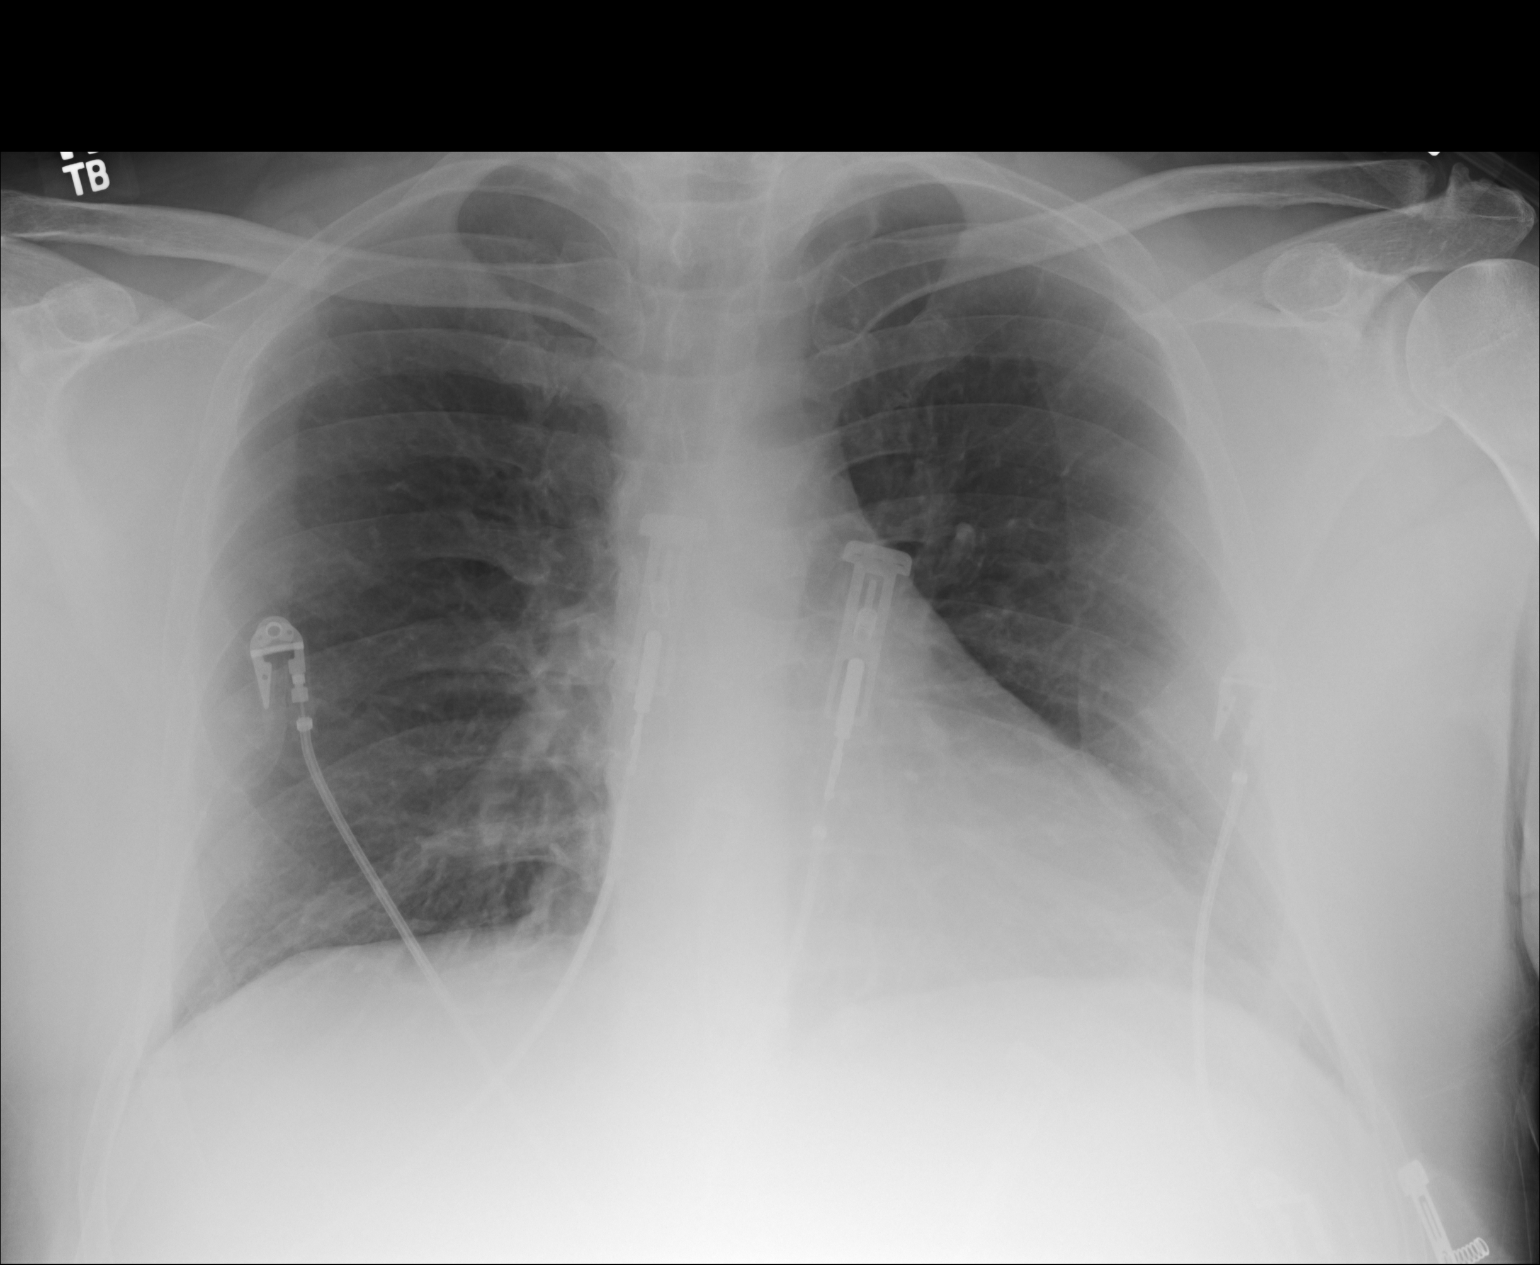

[1 of 1 positions shown; findings below may reference images not displayed]

FINDINGS: Single portable AP chest radiograph is provided.  There is no focal
parenchymal opacity, pleural effusion, or pneumothorax. Normal
cardiomediastinal silhouette. The osseous structures are unremarkable.
IMPRESSION: No acute disease of the che[REDACTED]

## 2013-12-12 ENCOUNTER — Other Ambulatory Visit: Payer: Self-pay | Admitting: *Deleted

## 2014-01-24 ENCOUNTER — Ambulatory Visit (INDEPENDENT_AMBULATORY_CARE_PROVIDER_SITE_OTHER): Payer: BC Managed Care – PPO | Admitting: Internal Medicine

## 2014-01-24 ENCOUNTER — Encounter (INDEPENDENT_AMBULATORY_CARE_PROVIDER_SITE_OTHER): Payer: Self-pay

## 2014-01-24 ENCOUNTER — Encounter: Payer: Self-pay | Admitting: Internal Medicine

## 2014-01-24 VITALS — BP 136/90 | HR 86 | Ht 74.0 in | Wt 279.0 lb

## 2014-01-24 DIAGNOSIS — I471 Supraventricular tachycardia: Secondary | ICD-10-CM

## 2014-01-24 MED ORDER — LISINOPRIL-HYDROCHLOROTHIAZIDE 20-12.5 MG PO TABS: 1.0000 | ORAL_TABLET | Freq: Every day | ORAL | Status: AC

## 2014-01-24 MED ORDER — CLOPIDOGREL BISULFATE 75 MG PO TABS: 75.0000 mg | ORAL_TABLET | Freq: Every day | ORAL | Status: AC

## 2014-01-24 MED ORDER — ATORVASTATIN CALCIUM 40 MG PO TABS: 40.0000 mg | ORAL_TABLET | Freq: Every day | ORAL | Status: AC

## 2014-01-24 NOTE — Progress Notes (Signed)
skf .sfk Patient Care Team: Leotis ShamesJasmine Singh, MD as PCP - General (Internal Medicine)   HPI  Gabriel MinorsWilliam R Alvarado is a 53 y.o. male Seen in followup for SVT. He underwent catheter ablation 11/13 a left posterior accessory pathway. He has had no recurrent tachypalpitations. He denies daytime somnolence. No significant edema. Struggling with keeping his weight down.  He has a history of coronary artery disease with [facility]  he has had no problems with intercurrent chest pain. Functional status is otherwise stable and there is been no edema.  He snores a little bit; he does not have significant daytime somnolence.  Past Medical History  Diagnosis Date  . Diabetes mellitus   . Hyperprolactinemia   . AAA (abdominal aortic aneurysm)   . Obesity   . SVT- s/p RFCA 10/1     left posterior accessory pathyway  . CAD (coronary artery disease) s/p PCI     10/13    Past Surgical History  Procedure Laterality Date  . Foot surgery      left foot  . Cardiac catheterization  2013    x1; [facility], [place]    Current Outpatient Prescriptions  Medication Sig Dispense Refill  . aspirin EC 81 MG tablet Take 81 mg by mouth daily.    . cabergoline (DOSTINEX) 0.5 MG tablet Take 0.25 mg by mouth once a week.     . clopidogrel (PLAVIX) 75 MG tablet Take 75 mg by mouth daily.    Marland Kitchen. glimepiride (AMARYL) 4 MG tablet Take 4 mg by mouth 2 (two) times daily.    . insulin glargine (LANTUS) 100 UNIT/ML injection Inject 15-25 Units into the skin 2 (two) times daily. 15 units in the morning and 25 units at night    . metFORMIN (GLUCOPHAGE) 1000 MG tablet Take 1,500 mg by mouth daily.     . pioglitazone (ACTOS) 45 MG tablet Take 45 mg by mouth daily.    . SitaGLIPtin-MetFORMIN HCl 50-1000 MG TB24 Take 2 tablets by mouth daily.     Marland Kitchen. atorvastatin (LIPITOR) 40 MG tablet Take 1 tablet (40 mg total) by mouth daily. (Patient not taking: Reported on 01/24/2014) 90 tablet 3  . lisinopril  (PRINIVIL,ZESTRIL) 10 MG tablet Take 2 tablets (20 mg total) by mouth daily. (Patient not taking: Reported on 01/24/2014) 90 tablet 3  . metoprolol (LOPRESSOR) 100 MG tablet Take 0.5 tablets (50 mg total) by mouth 2 (two) times daily. (Patient not taking: Reported on 01/24/2014) 180 tablet 3   No current facility-administered medications for this visit.    Allergies  Allergen Reactions  . Penicillins Other (See Comments)    Unknown reaction pt has taken pcn derivatives   . Potassium-Containing Compounds Other (See Comments)    Unknown reaction-pt unaware of any allergy to kcl    Review of Systems negative except from HPI and PMH  Physical Exam BP 136/90 mmHg  Pulse 86  Ht 6\' 2"  (1.88 m)  Wt 279 lb (126.554 kg)  BMI 35.81 kg/m2 Well developed and well nourished in no acute distress HENT normal E scleral and icterus clear Neck Supple Clear to ausculation  Regular rate and rhythm, no murmurs gallops or rub Soft with active bowel sounds No clubbing cyanosis none Edema Alert and oriented, grossly normal motor and sensory function Skin Warm and Dry  Electrocardiogram demonstrates sinus rhythm at 86 Intervals 17/09/37  LAE Otherwise normal   Assessment and  Plan  SVT  Hypertension  Coronary artery  disease with prior stenting  His blood pressure is poorly controlled. We will add HCT to his regime his blood work is to be checked today and I've told him that he needs repeat potassium follow-up in about 3 weeks time which we will do with his PCP  LV function was normal; there is no indication for maintaining his beta blockers and we will stop the med 0 contributing to fatigue.  I've asked him to follow-up with Dr. Lady GaryFATH to consider the elimination of one of his antiplatelet drugs following his stenting 2 years ago

## 2014-01-24 NOTE — Patient Instructions (Signed)
Your physician has recommended you make the following change in your medication:  Stop Metoprolol  Stop Lisinopril  Start Lisinopril-HCT 20/12.5 mg once daily   Your other medications have been refilled as requested   Your physician recommends that you schedule a follow-up appointment in:  As needed with Dr. Graciela HusbandsKlein

## 2014-01-26 ENCOUNTER — Encounter (HOSPITAL_COMMUNITY): Payer: Self-pay | Admitting: Internal Medicine

## 2014-06-06 NOTE — H&P (Signed)
PATIENT NAME:  Gabriel Alvarado, Gabriel Alvarado MR#:  [id number] DATE OF BIRTH:  1960/11/26  DATE OF ADMISSION:  09/02/2011  PRIMARY CARE PHYSICIAN: Dr. Silver Huguenin   CHIEF COMPLAINT: Palpitations.   HISTORY OF PRESENT ILLNESS: This is a 54 year old male who presents to the Emergency Room due to palpitations and noted to be in supraventricular tachycardia. The patient said that he was sitting in his chair not doing anything when he suddenly started to feel like his heart was racing. He then became a bit diaphoretic, became somewhat dizzy and lightheaded, and developed some bilateral shoulder blade/chest pain/pressure. He was therefore a bit concerned and came to the ER for further evaluation and was noted to be in supraventricular tachycardia with heart rates in the 170s to 180s. The patient received two doses of IV adenosine and was then given some p.o. Cardizem and has slowed down to heart rates in the 90s to 100s. He currently does not complain of any chest pain or shortness of breath, any nausea or vomiting.  He denies any other complaints presently. His troponin was noted to be mildly elevated at 0.4, and given his supraventricular tachycardia the hospitalist service was contacted for further treatment and evaluation.   REVIEW OF SYSTEMS: CONSTITUTIONAL: No documented fever. No weight gain or weight loss. EYES: No blurry or double vision. ENT: No tinnitus. No postnasal drip. No redness of the oropharynx. RESPIRATORY: No cough, no wheeze, no hemoptysis. CARDIOVASCULAR: Positive for palpitations. No syncope. Positive chest pain. No orthopnea. No PND. GASTROINTESTINAL: No nausea, no vomiting, no diarrhea, no abdominal pain, no melena, no hematochezia. GU: No dysuria no hematuria. ENDOCRINE: No polyuria or nocturia. No heat or cold intolerance.  HEME: No anemia. No bruising. No bleeding. INTEGUMENT: No rashes. No lesions. MUSCULOSKELETAL: No arthritis, no swelling, no gout. NEUROLOGIC: No numbness, no tingling, no  ataxia, no seizure-type activity. PSYCH: No anxiety, no insomnia, no ADD.   PAST MEDICAL HISTORY:  1. Diabetes. 2. Hyperprolactinemia.   ALLERGIES: No known drug allergies.   SOCIAL HISTORY: No smoking. He does drink occasionally. No illicit drug abuse. Lives at home with his wife and family.   CURRENT MEDICATIONS:  1. Actos 15 mg daily. 2. Cabergoline 0.5 mg on Wednesdays and Sundays. 3. Glimepiride 4 mg b.i.d.  4. Lantus 45 units at bedtime.  5. Metformin 1000 mg in the morning and 1500 mg in the evening.   FAMILY HISTORY: The patient's father died from a stroke. He had a history of diabetes. Mother is alive and healthy.   PHYSICAL EXAMINATION: VITAL SIGNS: Temperature 98.7, pulse 97, respirations 18, blood pressure 119/76, stats 96% on room air.   GENERAL: He is a pleasant appearing male in no apparent distress.   HEENT: Atraumatic, normocephalic. Extraocular muscles are intact. Pupils are equal and reactive to light. Sclerae anicteric. No conjunctival injection. No oropharyngeal erythema.   NECK: Supple. No jugular venous distention, no bruits, no lymphadenopathy, no thyromegaly.   HEART: Regular rate and rhythm. No murmurs, rubs, or clicks.   LUNGS: Clear to auscultation bilaterally. No rales, no rhonchi, no wheezes.   ABDOMEN: Soft, flat, nontender, nondistended. Has good bowel sounds. No hepatosplenomegaly appreciated.   EXTREMITIES: No evidence of any cyanosis, clubbing, or peripheral edema. Has +2 pedal and radial pulses bilaterally.   NEUROLOGICAL: The patient is alert, awake, and oriented times three with no focal motor or sensory deficits appreciated bilaterally.   SKIN: Moist and warm with no rash appreciated.   LYMPHATIC: There is no cervical or axillary  lymphadenopathy.   LABORATORY, DIAGNOSTIC, AND RADIOLOGICAL DATA: Serum glucose at 254, BUN 15, creatinine 0.9, sodium 137, potassium 4.3, chloride 97, bicarbonate 27. LFTs are within normal limits. Troponin  0.14. TSH 2.04.  White cell count is 12, hemoglobin 17, hematocrit 51.4, platelet count 215.   ASSESSMENT AND PLAN: This is a 54 year old male with past medical history of diabetes and hyperprolactinemia who presents to the hospital due to palpitations, noted to be in supraventricular tachycardia.  1. Supraventricular tachycardia. The patient was symptomatic with it as he developed some diaphoresis, palpitations, and some chest pressure with this. He has now slowed down in the ER after getting some adenosine and some Cardizem. I will observe him on telemetry, continue serial cardiac markers, get echocardiogram, and get a cardiology consult. I discussed the case with Dr. Gwen PoundsKowalski who will see the patient. I will also start the patient on some low-dose beta blocker and follow him clinically.  2. Elevated troponin. I think this is likely tachycardia- mediated, unlikely related to acute coronary syndrome. He is currently asymptomatic. Observe him on telemetry, check serial cardiac markers, obtain echocardiogram, continue aspirin and low-dose beta blocker, check a lipid profile in the morning.  3. Diabetes. Continue with his glimepiride, glipizide, Lantus, and metformin. Follow blood sugars, place him on a carb-controlled diet.  4. Hyperprolactinemia. We will continue his cabergoline.   CODE STATUS: The patient is a FULL CODE.  TIME SPENT: 45 minutes.  ____________________________ Rolly PancakeVivek J. Cherlynn KaiserSainani, MD vjs:bjt D: 09/02/2011 20:25:59 ET T: 09/03/2011 07:34:17 ET JOB#: [id number]  cc: Yetta FlockAileen H. Miller, MD Houston SirenVIVEK J SAINANI MD ELECTRONICALLY SIGNED 09/03/2011 14:03

## 2014-06-06 NOTE — Consult Note (Signed)
PATIENT NAME:  Gabriel Alvarado, Gabriel Alvarado MR#:  [id number] DATE OF BIRTH:  11-03-1960  DATE OF CONSULTATION:  09/03/2011  REFERRING PHYSICIAN:  Hilda Lias, MD  CONSULTING PHYSICIAN:  Gabriel Millard, MD  PRIMARY CARE PHYSICIAN: Silver Huguenin, MD   CHIEF COMPLAINT: Palpitations.   REASON FOR CONSULTATION: Consultation is requested by Dr. Cherlynn Kaiser for evaluation for supraventricular tachycardia and elevated troponin.   HISTORY OF PRESENT ILLNESS: The patient is a 54 year old gentleman without prior cardiac history. He was in his usual state of health until yesterday when while sitting in a chair he noted his heart was racing. The patient reports that while his heart was racing he noticed some chest discomfort and then became lightheaded when his heart would seem to go back in a normal rhythm briefly. The patient presented to [facility] Emergency Room where he was noted to be in narrow complex tachycardia at a rate of 170 to 180. The patient was given two doses of intravenous adenosine apparently without significant effect. The patient then was given a Cardizem bolus followed by oral Cardizem and the patient converted to sinus rhythm. The patient has remained in sinus rhythm during the hospitalization. The patient does have borderline elevated troponin of 0.15. Follow-up EKG revealed sinus rhythm without any acute ischemic ST-T wave changes. The patient denies prior history of chest pain or exertional dyspnea.   PAST MEDICAL HISTORY:  1. Insulin requiring diabetes. 2. Hyperprolactinemia with history of pituitary tumor.   MEDICATIONS:  1. Actos 15 mg daily.  2. Glimepiride 4 mg b.i.d.  3. Lantus 45 units at bedtime. 4. Metformin 1000 mg q.a.m., 1500 mg q.p.m. 5. Cabergoline 0.5 mg on Wednesdays and Sundays.   SOCIAL HISTORY: The patient is married. Resides with his wife. He drinks occasionally. He typically has 2 to 3 drinks twice weekly. The patient did have a couple glasses of wine the evening  before his episode of tachycardia.   FAMILY HISTORY: Father died status post CVA.   REVIEW OF SYSTEMS: CONSTITUTIONAL: No fever or chills. EYES: No blurry vision. EARS: No hearing loss. RESPIRATORY: No shortness of breath. CARDIOVASCULAR: Palpitations as described above. GI: The patient did have an episode of nausea yesterday morning. Denies constipation or diarrhea. GU: No dysuria or hematuria. ENDOCRINE: No polyuria or polydipsia. INTEGUMENTARY: No rash. MUSCULOSKELETAL: No arthralgias or myalgias. NEUROLOGICAL: No focal muscle weakness or numbness. PSYCHOLOGICAL: No depression or anxiety.   PHYSICAL EXAMINATION:   VITAL SIGNS: Blood pressure 124/82, pulse 72, respirations 18, temperature 98.7, pulse oximetry 97%.   HEENT: Pupils equal and reactive to light and accommodation.   NECK: Supple without thyromegaly.   LUNGS: Clear.   CARDIOVASCULAR: Normal JVP. Normal PMI. Regular rate and rhythm. Normal S1, S2. No appreciable gallop, murmur, or rub.   ABDOMEN: Soft, nontender. Pulses were intact bilaterally.   MUSCULOSKELETAL: Normal muscle tone.   NEUROLOGIC: The patient is alert and oriented x3. Motor and sensory both grossly intact.   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 The patient has remained in sinus rhythm on oral metoprolol. The patient has borderline elevated troponin which was likely tachycardia mediated and not due to acute coronary syndrome. The patient has had no prior history of chest pain or exertional shortness of breath.   RECOMMENDATIONS:  1. Agree with current therapy.  2. Would defer full dose anticoagulation.  3. Review 2-D echocardiogram.  4. Change metoprolol tartrate to metoprolol succinate 50  mg daily.  5. Recommend aspirin 81 mg daily.  6. No further cardiac diagnostics required at this time.  7. Follow-up as outpatient in 1 to 2 weeks at  which time may consider functional study.   ____________________________ Gabriel MillardAlexander Kagan Mutchler, MD ap:drc D: 09/03/2011 13:23:55 ET T: 09/03/2011 13:32:52 ET JOB#: [id number]  cc: Gabriel MillardAlexander Kathleene Bergemann, MD, <Dictator> Gabriel MillardALEXANDER Krimson Massmann MD ELECTRONICALLY SIGNED 09/16/2011 8:26

## 2014-06-06 NOTE — Discharge Summary (Signed)
PATIENT NAME:  Gabriel Alvarado, Gabriel Alvarado MR#:  [id number] DATE OF BIRTH:  30-Dec-1960  DATE OF ADMISSION:  09/02/2011 DATE OF DISCHARGE:  09/03/2011  DISCHARGE DIAGNOSES:  1. Symptomatic supraventricular tachycardia, now converted into sinus rhythm and symptoms resolved.  2. Elevated troponin, likely tachycardia related.  3. Supply/demand ischemia.   SECONDARY DIAGNOSES:  1. Diabetes. 2. Hyperprolactinemia.   CONSULTATION: Cardiology, Dr. Darrold JunkerParaschos.   PROCEDURE/RADIOLOGY: Chest x-ray on 07/16 showed no acute cardiopulmonary disease.   MAJOR LABORATORY PANEL: Urinalysis on admission was negative.   HISTORY AND SHORT HOSPITAL COURSE: The patient is a 54 year old male with above-mentioned medical problems who was admitted for symptomatic supraventricular tachycardia along with borderline elevated troponin. Please see Dr. Hilbert OdorSainani's dictated history and physical for further details. Cardiology consultation was obtained with Dr. Darrold JunkerParaschos who recommended using metoprolol. The patient was already converted back to normal sinus rhythm. His last 24-hour telemetry monitoring did not show any supraventricular tachycardia. The patient's symptoms were also resolved. After discussion with cardiology and the patient, he was discharged home in stable condition. He did have some hypercalcemia with a value of 11.8 which was also normalized at a value of 9.5 on the date of discharge. His hemoglobin A1c was 9.4, which was suggestive of poor glycemic control. He had a normal TSH. He was feeling much better and was discharged home on 07/17 in stable condition.   PERTINENT PHYSICAL EXAMINATION ON THE DATE OF DISCHARGE: VITAL SIGNS: On the date of discharge, his vital signs were as follows: Temperature 98.7, heart rate 72 per minute, respirations 18 per minute, blood pressure 124/82. He was saturating 97% on room air. CARDIOVASCULAR: S1, S2 normal. No murmurs, rubs, or gallop. LUNGS: Clear to auscultation bilaterally. No  wheezing, rales, rhonchi, or crepitation. ABDOMEN: Soft, benign. NEUROLOGIC: Nonfocal examination. All other physical examination remained at baseline.   DISCHARGE MEDICATIONS:  1. Cabergoline 0.5 mg p.o. twice a week on Wednesday and Sunday.  2. Glimepiride 4 mg p.o. b.i.d.  3. Actos 15 mg p.o. daily.  4. Insulin Lantus 45 units subcutaneous at bedtime.  5. Metformin 1000 mg p.o. daily morning and 1500 mg p.o. every evening.  6. Metoprolol succinate 50 mg once daily.  7. Aspirin 81 mg p.o. daily.   DISCHARGE DIET: Low sodium, 1,800 ADA.   DISCHARGE ACTIVITY: As tolerated.   DISCHARGE INSTRUCTIONS AND FOLLOW-UP:  1. The patient was instructed to follow-up with his primary care physician, Dr. Dewaine Oatsenny Tate, in 1 to 2 weeks.  2. He will then follow-up with his cardiologist, Dr. Darrold JunkerParaschos, in 1 to 2 weeks.  3. He was instructed to avoid alcohol.   TOTAL TIME DISCHARGING THIS PATIENT: 45 minutes.    ____________________________ Ellamae SiaVipul S. Sherryll BurgerShah, MD vss:ap D: 09/03/2011 16:41:00 ET T: 09/04/2011 11:46:58 ET JOB#: [id number]  cc: Akosua Constantine S. Sherryll BurgerShah, MD, <Dictator> Jillene Bucksenny C. Arlana Pouchate, MD Marcina MillardAlexander Paraschos, MD Ellamae SiaVIPUL S [facility] MD ELECTRONICALLY SIGNED 09/04/2011 22:47

## 2014-06-06 NOTE — Consult Note (Signed)
Brief Consult Note: Diagnosis: SVT, in sinus. Borderline elevated troponin, probable demand/supply secondary to tachycardia, no CP.   Patient was seen by consultant.   Consult note dictated.   Comments: REC  Agree wtih current therapy, cont metoprolol and ASA, abstain from alcohol, review echo, may continue w/u as out-patient.  Electronic Signatures: Marcina MillardParaschos, Sky Primo (MD)  (Signed 17-Jul-13 13:25)  Authored: Brief Consult Note   Last Updated: 17-Jul-13 13:25 by Marcina MillardParaschos, Estill Llerena (MD)

## 2020-03-12 ENCOUNTER — Other Ambulatory Visit: Payer: Self-pay

## 2020-03-12 ENCOUNTER — Ambulatory Visit
Admission: EM | Admit: 2020-03-12 | Discharge: 2020-03-12 | Disposition: A | Discharge status: Home / Self Care | Payer: BC Managed Care – PPO | Attending: Family Medicine | Admitting: Family Medicine

## 2020-03-12 DIAGNOSIS — B379 Candidiasis, unspecified: Secondary | ICD-10-CM | POA: Insufficient documentation

## 2020-03-12 LAB — URINALYSIS, COMPLETE (UACMP) WITH MICROSCOPIC
Bacteria, UA: NONE SEEN
Bilirubin Urine: NEGATIVE
Glucose, UA: >1000 mg/dL — AB
Hgb urine dipstick: NEGATIVE
Ketones, ur: 15 mg/dL — AB
Leukocytes,Ua: NEGATIVE
Nitrite: NEGATIVE
Protein, ur: 30 mg/dL — AB
RBC / HPF: NONE SEEN RBC/hpf (ref 0–5)
Specific Gravity, Urine: 1.015 (ref 1.005–1.030)
pH: 5.5 (ref 5.0–8.0)

## 2020-03-12 MED ORDER — FLUCONAZOLE 150 MG PO TABS: 150.0000 mg | ORAL_TABLET | Freq: Once | ORAL | 0 refills | Status: AC

## 2020-03-12 NOTE — ED Triage Notes (Signed)
Pt presents with c/o urinary frequency, decreased flow and some possible retention. Pt also reports pain and pain and burning during urination. Pt denies abd pain, n/v/d/f. Pt denies any history of UTI's. Pt denies hematuria.

## 2020-03-12 NOTE — ED Provider Notes (Signed)
[facility]    CSN: [id number] Arrival date & time: 03/12/20  1009      History   Chief Complaint Chief Complaint  Patient presents with  . Dysuria   HPI   60 year old male presents with urinary symptoms.  Patient reports that his symptoms started on Friday.  Reports frequency, urgency, and dysuria.  Denies fever.  Denies abdominal pain.  No lower back pain/flank pain.  Pain 7/10 in severity.  No relieving factors.  No other reported symptoms.  No other complaints.  Past Medical History:  Diagnosis Date  . AAA (abdominal aortic aneurysm) (HCC)   . CAD (coronary artery disease) s/p PCI    10/13  . Diabetes mellitus (HCC)   . Hyperprolactinemia (HCC)   . Obesity   . SVT- s/p RFCA 10/1    left posterior accessory pathyway    Patient Active Problem List   Diagnosis Date Noted  . Hypertension 03/23/2012  . Coronary artery disease  status post RCA DES stenting 9/13 12/02/2011  . CAD (coronary artery disease) 12/02/2011  . SVT (supraventricular tachycardia) (HCC) 10/06/2011  . AAA (abdominal aortic aneurysm) (HCC)   . Diabetes mellitus (HCC)     Past Surgical History:  Procedure Laterality Date  . CARDIAC CATHETERIZATION  2013   x1; [facility], [place]  . FOOT SURGERY     left foot  . SUPRAVENTRICULAR TACHYCARDIA ABLATION N/A 12/23/2011   Procedure: SUPRAVENTRICULAR TACHYCARDIA ABLATION;  Surgeon: Marinus Maw, MD;  Location: [facility] CATH LAB;  Service: Cardiovascular;  Laterality: N/A;       Home Medications    Prior to Admission medications   Medication Sig Start Date End Date Taking? Authorizing Provider  aspirin EC 81 MG tablet Take 81 mg by mouth daily.   Yes [provider]  cabergoline (DOSTINEX) 0.5 MG tablet Take 0.25 mg by mouth once a week.   Yes [provider]  fluconazole (DIFLUCAN) 150 MG tablet Take 1 tablet (150 mg total) by mouth once for 1 dose. Repeat dose in 72 hours. 03/12/20 03/12/20 Yes Kynlei Piontek G, DO  glimepiride  (AMARYL) 4 MG tablet Take 4 mg by mouth 2 (two) times daily.   Yes [provider]  insulin degludec (TRESIBA FLEXTOUCH) 100 UNIT/ML FlexTouch Pen Inject 50 Units into the skin daily. 01/17/19  Yes [provider]  lisinopril (ZESTRIL) 10 MG tablet Take 10 mg by mouth daily.   Yes [provider]  metFORMIN (GLUCOPHAGE) 1000 MG tablet Take 1,500 mg by mouth daily.   Yes [provider]  Semaglutide, 1 MG/DOSE, (OZEMPIC, 1 MG/DOSE,) 2 MG/1.5ML SOPN Inject 0.75 mLs into the skin once a week. 01/17/19  Yes [provider]  atorvastatin (LIPITOR) 40 MG tablet Take 1 tablet (40 mg total) by mouth daily. 01/24/14   Duke Salvia, MD  clopidogrel (PLAVIX) 75 MG tablet Take 1 tablet (75 mg total) by mouth daily. 01/24/14   Duke Salvia, MD  lisinopril-hydrochlorothiazide (PRINZIDE,ZESTORETIC) 20-12.5 MG per tablet Take 1 tablet by mouth daily. 01/24/14   Duke Salvia, MD  pioglitazone (ACTOS) 45 MG tablet Take 45 mg by mouth daily.    [provider]    Social History Social History   Tobacco Use  . Smoking status: Former Smoker    Packs/day: 2.00    Years: 15.00    Pack years: 30.00    Types: Cigarettes    Quit date: 06/20/1990    Years since quitting: 29.7  . Smokeless tobacco:  Never Used  Vaping Use  . Vaping Use: Never used  Substance Use Topics  . Alcohol use: Yes  . Drug use: Yes    Types: Marijuana     Allergies   Penicillins   Review of Systems Review of Systems  Constitutional: Negative.   Genitourinary: Positive for dysuria, frequency and urgency.   Physical Exam Triage Vital Signs ED Triage Vitals  Enc Vitals Group     BP 03/12/20 1040 (!) 147/89     Pulse Rate 03/12/20 1040 99     Resp 03/12/20 1040 18     Temp 03/12/20 1040 98.3 F (36.8 C)     Temp Source 03/12/20 1040 Oral     SpO2 03/12/20 1040 99 %     Weight 03/12/20 1035 238 lb (108 kg)     Height 03/12/20 1035 6\' 2"  (1.88 m)     Head  Circumference --      Peak Flow --      Pain Score 03/12/20 1034 7     Pain Loc --      Pain Edu? --      Excl. in GC? --    Updated Vital Signs BP (!) 147/89 (BP Location: Left Arm)   Pulse 99   Temp 98.3 F (36.8 C) (Oral)   Resp 18   Ht 6\' 2"  (1.88 m)   Wt 108 kg   SpO2 99%   BMI 30.56 kg/m   Visual Acuity Right Eye Distance:   Left Eye Distance:   Bilateral Distance:    Right Eye Near:   Left Eye Near:    Bilateral Near:     Physical Exam Constitutional:      General: He is not in acute distress.    Appearance: Normal appearance. He is not ill-appearing.  HENT:     Head: Normocephalic and atraumatic.  Eyes:     General:        Right eye: No discharge.        Left eye: No discharge.     Conjunctiva/sclera: Conjunctivae normal.  Cardiovascular:     Rate and Rhythm: Normal rate and regular rhythm.  Pulmonary:     Effort: Pulmonary effort is normal. No respiratory distress.  Abdominal:     General: There is no distension.     Palpations: Abdomen is soft.     Tenderness: There is no abdominal tenderness. There is no right CVA tenderness or left CVA tenderness.  Neurological:     Mental Status: He is alert.  Psychiatric:        Mood and Affect: Mood normal.        Behavior: Behavior normal.    UC Treatments / Results  Labs (all labs ordered are listed, but only abnormal results are displayed) Labs Reviewed  URINALYSIS, COMPLETE (UACMP) WITH MICROSCOPIC - Abnormal; Notable for the following components:      Result Value   Glucose, UA >1,000 (*)    Ketones, ur 15 (*)    Protein, ur 30 (*)    All other components within normal limits    EKG   Radiology No results found.  Procedures Procedures (including critical care time)  Medications Ordered in UC Medications - No data to display  Initial Impression / Assessment and Plan / UC Course  I have reviewed the triage vital signs and the nursing notes.  Pertinent labs & imaging results that were  available during my care of the patient were reviewed by me and  considered in my medical decision making (see chart for details).    60 year old male presents with urinary symptoms.  Glucosuria and yeast noted on urinalysis and microscopy.  Placing on Diflucan.  Needs better control of diabetes.  Final Clinical Impressions(s) / UC Diagnoses   Final diagnoses:  Yeast infection     Discharge Instructions     Medication as prescribed.  Take care  Dr. Adriana Simas    ED Prescriptions    Medication Sig Dispense Auth. Provider   fluconazole (DIFLUCAN) 150 MG tablet Take 1 tablet (150 mg total) by mouth once for 1 dose. Repeat dose in 72 hours. 2 tablet Tommie Sams, DO     PDMP not reviewed this encounter.   Tommie Sams, [place] 03/12/20 1215

## 2020-03-12 NOTE — Discharge Instructions (Signed)
Medication as prescribed.  Take care  Dr. Treyson Axel  

## 2020-03-13 ENCOUNTER — Other Ambulatory Visit: Payer: Self-pay

## 2020-03-13 ENCOUNTER — Encounter: Payer: Self-pay | Admitting: Emergency Medicine

## 2020-03-13 ENCOUNTER — Ambulatory Visit
Admission: EM | Admit: 2020-03-13 | Discharge: 2020-03-13 | Disposition: A | Discharge status: Home / Self Care | Payer: BC Managed Care – PPO | Attending: Family Medicine | Admitting: Family Medicine

## 2020-03-13 DIAGNOSIS — N139 Obstructive and reflux uropathy, unspecified: Secondary | ICD-10-CM | POA: Diagnosis present

## 2020-03-13 LAB — URINALYSIS, COMPLETE (UACMP) WITH MICROSCOPIC
Bacteria, UA: NONE SEEN
Bilirubin Urine: NEGATIVE
Glucose, UA: 500 mg/dL — AB
Ketones, ur: 40 mg/dL — AB
Leukocytes,Ua: NEGATIVE
Nitrite: NEGATIVE
Protein, ur: 30 mg/dL — AB
Specific Gravity, Urine: 1.01 (ref 1.005–1.030)
Squamous Epithelial / HPF: NONE SEEN (ref 0–5)
pH: 5.5 (ref 5.0–8.0)

## 2020-03-13 NOTE — ED Triage Notes (Signed)
Pt was seen here yesterday, told he has a yeast infection, given Fluconazole, took one tab yesterday, continues to have same symptoms; dysuria and unable to empty bladder.

## 2020-03-13 NOTE — Discharge Instructions (Signed)
Go straight to the hospital [facility] - [address], [place], [place] [address]).  Best of luck  Take care  Dr. Adriana Simas

## 2020-03-13 NOTE — ED Provider Notes (Signed)
[facility]    CSN: [id number] Arrival date & time: 03/13/20  4970      History   Chief Complaint Chief Complaint  Patient presents with  . Urinary Retention  . Dysuria   HPI  60 year old male presents with the above complaint.  Patient seen here yesterday.  Patient now having much more difficulty urinating.  He states that he was up all night going to and from the bathroom.  He states that he is urinating very little.  He reports pain and pressure in the area of his bladder.  Also reports rectal pressure as well.  No fever.  Continues to have dysuria.  No other complaints.  Past Medical History:  Diagnosis Date  . AAA (abdominal aortic aneurysm) (HCC)   . CAD (coronary artery disease) s/p PCI    10/13  . Diabetes mellitus (HCC)   . Hyperprolactinemia (HCC)   . Obesity   . SVT- s/p RFCA 10/1    left posterior accessory pathyway    Patient Active Problem List   Diagnosis Date Noted  . Hypertension 03/23/2012  . Coronary artery disease  status post RCA DES stenting 9/13 12/02/2011  . CAD (coronary artery disease) 12/02/2011  . SVT (supraventricular tachycardia) (HCC) 10/06/2011  . AAA (abdominal aortic aneurysm) (HCC)   . Diabetes mellitus (HCC)     Past Surgical History:  Procedure Laterality Date  . CARDIAC CATHETERIZATION  2013   x1; [facility], [place]  . FOOT SURGERY     left foot  . SUPRAVENTRICULAR TACHYCARDIA ABLATION N/A 12/23/2011   Procedure: SUPRAVENTRICULAR TACHYCARDIA ABLATION;  Surgeon: Marinus Maw, MD;  Location: [facility] CATH LAB;  Service: Cardiovascular;  Laterality: N/A;       Home Medications    Prior to Admission medications   Medication Sig Start Date End Date Taking? Authorizing Provider  aspirin EC 81 MG tablet Take 81 mg by mouth daily.    [provider]  atorvastatin (LIPITOR) 40 MG tablet Take 1 tablet (40 mg total) by mouth daily. 01/24/14   Duke Salvia, MD  cabergoline (DOSTINEX) 0.5 MG tablet Take 0.25 mg by  mouth once a week.    [provider]  clopidogrel (PLAVIX) 75 MG tablet Take 1 tablet (75 mg total) by mouth daily. 01/24/14   Duke Salvia, MD  glimepiride (AMARYL) 4 MG tablet Take 4 mg by mouth 2 (two) times daily.    [provider]  insulin degludec (TRESIBA FLEXTOUCH) 100 UNIT/ML FlexTouch Pen Inject 50 Units into the skin daily. 01/17/19   [provider]  lisinopril (ZESTRIL) 10 MG tablet Take 10 mg by mouth daily.    [provider]  lisinopril-hydrochlorothiazide (PRINZIDE,ZESTORETIC) 20-12.5 MG per tablet Take 1 tablet by mouth daily. 01/24/14   Duke Salvia, MD  metFORMIN (GLUCOPHAGE) 1000 MG tablet Take 1,500 mg by mouth daily.    [provider]  Semaglutide, 1 MG/DOSE, (OZEMPIC, 1 MG/DOSE,) 2 MG/1.5ML SOPN Inject 0.75 mLs into the skin once a week. 01/17/19   [provider]  pioglitazone (ACTOS) 45 MG tablet Take 45 mg by mouth daily.  03/12/20  [provider]    Family History History reviewed. No pertinent family history.  Social History Social History   Tobacco Use  . Smoking status: Former Smoker    Packs/day: 2.00    Years: 15.00    Pack years: 30.00    Types: Cigarettes    Quit date: 06/20/1990    Years since quitting:  29.7  . Smokeless tobacco: Never Used  Vaping Use  . Vaping Use: Never used  Substance Use Topics  . Alcohol use: Yes  . Drug use: Yes    Types: Marijuana     Allergies   Penicillins   Review of Systems Review of Systems  Constitutional: Negative for fever.  Genitourinary: Positive for difficulty urinating, dysuria and urgency.   Physical Exam Triage Vital Signs ED Triage Vitals  Enc Vitals Group     BP 03/13/20 0839 (!) 146/89     Pulse Rate 03/13/20 0839 99     Resp 03/13/20 0839 20     Temp 03/13/20 0839 98.6 F (37 C)     Temp Source 03/13/20 0839 Oral     SpO2 --      Weight --      Height --      Head Circumference --      Peak Flow --      Pain Score  03/13/20 0835 7     Pain Loc --      Pain Edu? --      Excl. in GC? --    Updated Vital Signs BP (!) 146/89 (BP Location: Left Arm)   Pulse 99   Temp 98.6 F (37 C) (Oral)   Resp 20   Visual Acuity Right Eye Distance:   Left Eye Distance:   Bilateral Distance:    Right Eye Near:   Left Eye Near:    Bilateral Near:     Physical Exam Vitals and nursing note reviewed.  Constitutional:      General: He is not in acute distress.    Appearance: Normal appearance. He is not ill-appearing.  Eyes:     General:        Right eye: No discharge.        Left eye: No discharge.     Conjunctiva/sclera: Conjunctivae normal.  Pulmonary:     Effort: Pulmonary effort is normal. No respiratory distress.  Abdominal:     Comments: Distended bladder noted on exam.  Tender to palpation.  Neurological:     Mental Status: He is alert.  Psychiatric:        Mood and Affect: Mood normal.        Behavior: Behavior normal.     UC Treatments / Results  Labs (all labs ordered are listed, but only abnormal results are displayed) Labs Reviewed  URINALYSIS, COMPLETE (UACMP) WITH MICROSCOPIC - Abnormal; Notable for the following components:      Result Value   Color, Urine STRAW (*)    Glucose, UA 500 (*)    Hgb urine dipstick TRACE (*)    Ketones, ur 40 (*)    Protein, ur 30 (*)    All other components within normal limits  URINE CULTURE    EKG   Radiology No results found.  Procedures Procedures (including critical care time)  Medications Ordered in UC Medications - No data to display  Initial Impression / Assessment and Plan / UC Course  I have reviewed the triage vital signs and the nursing notes.  Pertinent labs & imaging results that were available during my care of the patient were reviewed by me and considered in my medical decision making (see chart for details).    60 year old male presents with acute urinary obstruction.  Patient is going straight to the hospital as  he needs Foley catheter placed and will need urology follow-up.  Final Clinical Impressions(s) / UC Diagnoses  Final diagnoses:  Acute urinary obstruction     Discharge Instructions     Go straight to the hospital [facility] - [address], [place], [place] [address]).  Best of luck  Take care  Dr. Adriana Simas    ED Prescriptions    None     PDMP not reviewed this encounter.   Everlene Other [place], [place] 03/13/20 [phone]

## 2020-03-14 LAB — URINE CULTURE: Culture: <10000 — AB

## 2023-01-26 ENCOUNTER — Ambulatory Visit
Admission: EM | Admit: 2023-01-26 | Discharge: 2023-01-26 | Disposition: A | Discharge status: Home / Self Care | Payer: BC Managed Care – PPO | Attending: Emergency Medicine | Admitting: Emergency Medicine

## 2023-01-26 ENCOUNTER — Encounter: Payer: Self-pay | Admitting: Emergency Medicine

## 2023-01-26 ENCOUNTER — Ambulatory Visit: Payer: BC Managed Care – PPO

## 2023-01-26 DIAGNOSIS — S20211A Contusion of right front wall of thorax, initial encounter: Secondary | ICD-10-CM | POA: Diagnosis not present

## 2023-01-26 MED ORDER — TRAMADOL HCL 50 MG PO TABS: 100.0000 mg | ORAL_TABLET | Freq: Four times a day (QID) | ORAL | 0 refills | Status: AC | PRN

## 2023-01-26 NOTE — ED Provider Notes (Addendum)
[facility]    CSN: [id number] Arrival date & time: 01/26/23  1151      History   Chief Complaint Chief Complaint  Patient presents with   Fall    HPI Gabriel Alvarado is a 62 y.o. male.   HPI  62 year old male with a past medical history significant for hyperprolactinemia, SVT, diabetes, CAD, and AAA presents for evaluation of right anterolateral chest wall pain that began after he stepped in a hole in his yard 8 days ago and fell onto his right side.  He states that in the mornings when he first gets up he is a little short of breath but after he coughs he feels better.  The pain is interfering with his sleep.  He denies any hemoptysis or bruising.  Past Medical History:  Diagnosis Date   AAA (abdominal aortic aneurysm) (HCC)    CAD (coronary artery disease) s/p PCI    10/13   Diabetes mellitus (HCC)    Hyperprolactinemia (HCC)    Obesity    SVT- s/p RFCA 10/1    left posterior accessory pathyway    Patient Active Problem List   Diagnosis Date Noted   Hypertension 03/23/2012   Coronary artery disease  status post RCA DES stenting 9/13 12/02/2011   CAD (coronary artery disease) 12/02/2011   SVT (supraventricular tachycardia) (HCC) 10/06/2011   AAA (abdominal aortic aneurysm) (HCC)    Diabetes mellitus (HCC)     Past Surgical History:  Procedure Laterality Date   CARDIAC CATHETERIZATION  2013   x1; [facility], [place]   FOOT SURGERY     left foot   SUPRAVENTRICULAR TACHYCARDIA ABLATION N/A 12/23/2011   Procedure: SUPRAVENTRICULAR TACHYCARDIA ABLATION;  Surgeon: Marinus Maw, MD;  Location: [facility] CATH LAB;  Service: Cardiovascular;  Laterality: N/A;       Home Medications    Prior to Admission medications   Medication Sig Start Date End Date Taking? Authorizing Provider  traMADol (ULTRAM) 50 MG tablet Take 2 tablets (100 mg total) by mouth every 6 (six) hours as needed. 01/26/23  Yes Becky Augusta, NP  aspirin EC 81 MG tablet Take 81 mg by mouth  daily.    [provider]  atorvastatin (LIPITOR) 40 MG tablet Take 1 tablet (40 mg total) by mouth daily. 01/24/14   Duke Salvia, MD  cabergoline (DOSTINEX) 0.5 MG tablet Take 0.25 mg by mouth once a week.    [provider]  clopidogrel (PLAVIX) 75 MG tablet Take 1 tablet (75 mg total) by mouth daily. 01/24/14   Duke Salvia, MD  glimepiride (AMARYL) 4 MG tablet Take 4 mg by mouth 2 (two) times daily.    [provider]  insulin degludec (TRESIBA FLEXTOUCH) 100 UNIT/ML FlexTouch Pen Inject 50 Units into the skin daily. 01/17/19   [provider]  lisinopril (ZESTRIL) 10 MG tablet Take 10 mg by mouth daily.    [provider]  lisinopril-hydrochlorothiazide (PRINZIDE,ZESTORETIC) 20-12.5 MG per tablet Take 1 tablet by mouth daily. 01/24/14   Duke Salvia, MD  metFORMIN (GLUCOPHAGE) 1000 MG tablet Take 1,500 mg by mouth daily.    [provider]  Semaglutide, 1 MG/DOSE, (OZEMPIC, 1 MG/DOSE,) 2 MG/1.5ML SOPN Inject 0.75 mLs into the skin once a week. 01/17/19   [provider]  pioglitazone (ACTOS) 45 MG tablet Take 45 mg by mouth daily.  03/12/20  [provider]    Family History History reviewed. No pertinent family history.  Social History Social  History   Tobacco Use   Smoking status: Former    Current packs/day: 0.00    Average packs/day: 2.0 packs/day for 15.0 years (30.0 ttl pk-yrs)    Types: Cigarettes    Start date: 06/20/1975    Quit date: 06/20/1990    Years since quitting: 32.6   Smokeless tobacco: Never  Vaping Use   Vaping status: Never Used  Substance Use Topics   Alcohol use: Yes   Drug use: Yes    Types: Marijuana     Allergies   Penicillins   Review of Systems Review of Systems  Respiratory:  Positive for cough and shortness of breath. Negative for wheezing.   Cardiovascular:  Positive for chest pain.       Anterolateral chest wall pain  Skin:  Negative for color change.      Physical Exam Triage Vital Signs ED Triage Vitals [01/26/23 1241]  Encounter Vitals Group     BP      Systolic BP Percentile      Diastolic BP Percentile      Pulse      Resp      Temp      Temp src      SpO2      Weight      Height      Head Circumference      Peak Flow      Pain Score 4     Pain Loc      Pain Education      Exclude from Growth Chart    No data found.  Updated Vital Signs BP (!) 156/84 (BP Location: Left Arm)   Pulse 95   Temp 98.5 F (36.9 C) (Oral)   Resp 18   SpO2 95%   Visual Acuity Right Eye Distance:   Left Eye Distance:   Bilateral Distance:    Right Eye Near:   Left Eye Near:    Bilateral Near:     Physical Exam Vitals and nursing note reviewed.  Constitutional:      Appearance: Normal appearance. He is not ill-appearing.  HENT:     Head: Normocephalic and atraumatic.  Cardiovascular:     Rate and Rhythm: Normal rate and regular rhythm.     Pulses: Normal pulses.     Heart sounds: Normal heart sounds. No murmur heard.    No friction rub. No gallop.  Pulmonary:     Effort: Pulmonary effort is normal.     Breath sounds: Normal breath sounds. No wheezing, rhonchi or rales.     Comments: Patient has tenderness to palpation of the right fifth and sixth rib.  No overlying ecchymosis or erythema noted.  No crepitus appreciated on exam.  No subcu emphysema. Chest:     Chest wall: Tenderness present.  Skin:    General: Skin is warm and dry.     Capillary Refill: Capillary refill takes less than 2 seconds.     Findings: No bruising or erythema.  Neurological:     General: No focal deficit present.     Mental Status: He is alert and oriented to person, place, and time.      UC Treatments / Results  Labs (all labs ordered are listed, but only abnormal results are displayed) Labs Reviewed - No data to display  EKG   Radiology No results found.  Procedures Procedures (including critical care time)  Medications  Ordered in UC Medications - No data to display  Initial Impression /  Assessment and Plan / UC Course  I have reviewed the triage vital signs and the nursing notes.  Pertinent labs & imaging results that were available during my care of the patient were reviewed by me and considered in my medical decision making (see chart for details).   Patient is a pleasant, nontoxic-appearing 77 old male presenting for evaluation of pain in his right ribs after suffering a ground-level fall 8 days ago.  He endorses pain with deep respiration and also sleep disturbance secondary to the pain in his chest wall.  He is short of breath in the mornings but after he coughs he feels better.  He is concerned about developing pneumonia.  On exam patient has no visible bruising or edema to the anterior lateral chest wall.  He does have tenderness with palpation of the fifth and sixth ribs underneath his right pack but no crepitus.  No overlying erythema or ecchymosis.  No subcu eczema appreciated.  I will obtain a right rib series to evaluate for the presence of any broken ribs.  Right rib series independently reviewed and evaluated by me.  Impression: I do not appreciate any rib fractures or dislocations.  No pneumothorax present.  Radiology overread is pending. Radiology impression states no definite evidence of right rib fracture.  Minimal right pleural effusion.  Minimal bibasilar segmental atelectasis.  I will discharge patient home with diagnosis of right rib contusion and have him continue to take over-the-counter Tylenol as needed for pain.  He may also apply topical lidocaine patches.   Final Clinical Impressions(s) / UC Diagnoses   Final diagnoses:  Contusion of rib on right side, initial encounter     Discharge Instructions      I did not see any evidence of broken ribs on your chest x-ray.  I do believe you have bruised the ribs on your right-hand side.  Use over-the-counter Tylenol according to the  package instructions as needed for mild to moderate pain.  You may also apply topical, over-the-counter lidocaine patches to the area of pain.  Each patch is good for 8 hours.  You need to take 10 deep breaths every hour while awake to help ensure that your lungs stay expanded and mucus is not trapped which could lead to pneumonia.  I have sent a prescription for tramadol to the pharmacy that you can use for more severe pain or at bedtime.  Be mindful this medication will make you drowsy so do not drink alcohol or drive if you take it.  Also be mindful that if you need to get up in the night to use the bathroom that you need to make sure your faculties are about you before you attempt to stand and walk.  Return for reevaluation, or see your primary care provider, for any continued or worsening symptoms.     ED Prescriptions     Medication Sig Dispense Auth. Provider   traMADol (ULTRAM) 50 MG tablet Take 2 tablets (100 mg total) by mouth every 6 (six) hours as needed. 15 tablet Becky Augusta, NP      I have reviewed the PDMP during this encounter.   Becky Augusta, NP 01/26/23 1359    Becky Augusta, NP 01/26/23 1517

## 2023-01-26 NOTE — ED Triage Notes (Signed)
Pt fell in his yard 8 days ago he stepped into a hole and landed on his right side. He has rib cage pain and SOB.

## 2023-01-26 NOTE — Discharge Instructions (Addendum)
I did not see any evidence of broken ribs on your chest x-ray.  I do believe you have bruised the ribs on your right-hand side.  Use over-the-counter Tylenol according to the package instructions as needed for mild to moderate pain.  You may also apply topical, over-the-counter lidocaine patches to the area of pain.  Each patch is good for 8 hours.  You need to take 10 deep breaths every hour while awake to help ensure that your lungs stay expanded and mucus is not trapped which could lead to pneumonia.  I have sent a prescription for tramadol to the pharmacy that you can use for more severe pain or at bedtime.  Be mindful this medication will make you drowsy so do not drink alcohol or drive if you take it.  Also be mindful that if you need to get up in the night to use the bathroom that you need to make sure your faculties are about you before you attempt to stand and walk.  Return for reevaluation, or see your primary care provider, for any continued or worsening symptoms.
